# Patient Record
Sex: Female | Born: 1965 | Race: White | Hispanic: No | Smoking: Never smoker
Health system: Southern US, Community
[De-identification: ages and names within clinical notes are randomized; demographics above are authoritative.]

## PROBLEM LIST (undated history)

## (undated) DIAGNOSIS — I10 Essential (primary) hypertension: Secondary | ICD-10-CM

## (undated) DIAGNOSIS — E079 Disorder of thyroid, unspecified: Secondary | ICD-10-CM

## (undated) DIAGNOSIS — E785 Hyperlipidemia, unspecified: Secondary | ICD-10-CM

## (undated) HISTORY — DX: Essential (primary) hypertension: I10

## (undated) HISTORY — PX: NASAL SEPTUM SURGERY: SHX37

## (undated) HISTORY — PX: ANKLE RECONSTRUCTION: SHX1151

## (undated) HISTORY — PX: POLYPECTOMY: SHX149

## (undated) HISTORY — DX: Disorder of thyroid, unspecified: E07.9

## (undated) HISTORY — DX: Hyperlipidemia, unspecified: E78.5

## (undated) HISTORY — PX: TMJ ARTHROPLASTY: SHX1066

---

## 1998-04-23 ENCOUNTER — Other Ambulatory Visit: Admission: RE | Admit: 1998-04-23 | Discharge: 1998-04-23 | Payer: Self-pay | Admitting: Sports Medicine

## 1998-12-24 ENCOUNTER — Other Ambulatory Visit: Admission: RE | Admit: 1998-12-24 | Discharge: 1998-12-24 | Payer: Self-pay | Admitting: Obstetrics and Gynecology

## 1998-12-31 ENCOUNTER — Ambulatory Visit (HOSPITAL_BASED_OUTPATIENT_CLINIC_OR_DEPARTMENT_OTHER): Admission: RE | Admit: 1998-12-31 | Discharge: 1998-12-31 | Payer: Self-pay | Admitting: Orthopedic Surgery

## 1999-02-07 ENCOUNTER — Other Ambulatory Visit: Admission: RE | Admit: 1999-02-07 | Discharge: 1999-02-07 | Payer: Self-pay | Admitting: Obstetrics and Gynecology

## 1999-08-07 ENCOUNTER — Other Ambulatory Visit: Admission: RE | Admit: 1999-08-07 | Discharge: 1999-08-07 | Payer: Self-pay | Admitting: Obstetrics and Gynecology

## 1999-09-11 ENCOUNTER — Other Ambulatory Visit: Admission: RE | Admit: 1999-09-11 | Discharge: 1999-09-11 | Payer: Self-pay | Admitting: Obstetrics and Gynecology

## 1999-09-11 ENCOUNTER — Encounter (INDEPENDENT_AMBULATORY_CARE_PROVIDER_SITE_OTHER): Payer: Self-pay

## 1999-10-23 ENCOUNTER — Encounter (INDEPENDENT_AMBULATORY_CARE_PROVIDER_SITE_OTHER): Payer: Self-pay

## 1999-10-23 ENCOUNTER — Other Ambulatory Visit: Admission: RE | Admit: 1999-10-23 | Discharge: 1999-10-23 | Payer: Self-pay | Admitting: Obstetrics and Gynecology

## 2000-03-04 ENCOUNTER — Other Ambulatory Visit: Admission: RE | Admit: 2000-03-04 | Discharge: 2000-03-04 | Payer: Self-pay | Admitting: Obstetrics and Gynecology

## 2000-09-02 ENCOUNTER — Other Ambulatory Visit: Admission: RE | Admit: 2000-09-02 | Discharge: 2000-09-02 | Payer: Self-pay | Admitting: Obstetrics and Gynecology

## 2001-02-17 ENCOUNTER — Other Ambulatory Visit: Admission: RE | Admit: 2001-02-17 | Discharge: 2001-02-17 | Payer: Self-pay | Admitting: Obstetrics and Gynecology

## 2001-09-01 ENCOUNTER — Other Ambulatory Visit: Admission: RE | Admit: 2001-09-01 | Discharge: 2001-09-01 | Payer: Self-pay | Admitting: Obstetrics and Gynecology

## 2002-09-21 ENCOUNTER — Other Ambulatory Visit: Admission: RE | Admit: 2002-09-21 | Discharge: 2002-09-21 | Payer: Self-pay | Admitting: Obstetrics and Gynecology

## 2003-02-24 ENCOUNTER — Encounter: Payer: Self-pay | Admitting: Family Medicine

## 2003-02-24 ENCOUNTER — Encounter: Admission: RE | Admit: 2003-02-24 | Discharge: 2003-02-24 | Payer: Self-pay | Admitting: Family Medicine

## 2003-09-20 ENCOUNTER — Encounter: Payer: Self-pay | Admitting: Orthopedic Surgery

## 2003-09-20 ENCOUNTER — Encounter: Admission: RE | Admit: 2003-09-20 | Discharge: 2003-09-20 | Payer: Self-pay | Admitting: Orthopedic Surgery

## 2003-10-18 ENCOUNTER — Other Ambulatory Visit: Admission: RE | Admit: 2003-10-18 | Discharge: 2003-10-18 | Payer: Self-pay | Admitting: Obstetrics and Gynecology

## 2004-01-05 ENCOUNTER — Encounter: Admission: RE | Admit: 2004-01-05 | Discharge: 2004-01-05 | Payer: Self-pay | Admitting: Orthopedic Surgery

## 2004-05-06 ENCOUNTER — Encounter: Admission: RE | Admit: 2004-05-06 | Discharge: 2004-05-06 | Payer: Self-pay | Admitting: Sports Medicine

## 2005-02-05 ENCOUNTER — Other Ambulatory Visit: Admission: RE | Admit: 2005-02-05 | Discharge: 2005-02-05 | Payer: Self-pay | Admitting: Obstetrics and Gynecology

## 2006-01-02 ENCOUNTER — Encounter: Admission: RE | Admit: 2006-01-02 | Discharge: 2006-01-02 | Payer: Self-pay | Admitting: Obstetrics and Gynecology

## 2006-03-12 ENCOUNTER — Other Ambulatory Visit: Admission: RE | Admit: 2006-03-12 | Discharge: 2006-03-12 | Payer: Self-pay | Admitting: Obstetrics and Gynecology

## 2007-01-20 ENCOUNTER — Encounter: Admission: RE | Admit: 2007-01-20 | Discharge: 2007-01-20 | Payer: Self-pay | Admitting: Sports Medicine

## 2008-02-21 ENCOUNTER — Encounter: Admission: RE | Admit: 2008-02-21 | Discharge: 2008-02-21 | Payer: Self-pay | Admitting: Sports Medicine

## 2009-05-28 ENCOUNTER — Encounter: Admission: RE | Admit: 2009-05-28 | Discharge: 2009-05-28 | Payer: Self-pay | Admitting: Obstetrics and Gynecology

## 2010-05-30 ENCOUNTER — Encounter: Admission: RE | Admit: 2010-05-30 | Discharge: 2010-05-30 | Payer: Self-pay | Admitting: Obstetrics and Gynecology

## 2011-06-20 ENCOUNTER — Other Ambulatory Visit: Payer: Self-pay | Admitting: Obstetrics and Gynecology

## 2011-06-20 DIAGNOSIS — Z1231 Encounter for screening mammogram for malignant neoplasm of breast: Secondary | ICD-10-CM

## 2011-07-14 ENCOUNTER — Ambulatory Visit
Admission: RE | Admit: 2011-07-14 | Discharge: 2011-07-14 | Disposition: A | Discharge status: Home / Self Care | Payer: BC Managed Care – PPO | Source: Ambulatory Visit | Attending: Obstetrics and Gynecology | Admitting: Obstetrics and Gynecology

## 2011-07-14 DIAGNOSIS — Z1231 Encounter for screening mammogram for malignant neoplasm of breast: Secondary | ICD-10-CM

## 2012-08-03 ENCOUNTER — Encounter: Payer: Self-pay | Admitting: Sports Medicine

## 2012-08-03 ENCOUNTER — Ambulatory Visit (INDEPENDENT_AMBULATORY_CARE_PROVIDER_SITE_OTHER): Payer: BC Managed Care – PPO | Admitting: Sports Medicine

## 2012-08-03 VITALS — BP 126/81 | HR 90 | Ht 69.0 in | Wt 200.0 lb

## 2012-08-03 DIAGNOSIS — E7800 Pure hypercholesterolemia, unspecified: Secondary | ICD-10-CM

## 2012-08-03 DIAGNOSIS — I10 Essential (primary) hypertension: Secondary | ICD-10-CM

## 2012-08-03 DIAGNOSIS — R7989 Other specified abnormal findings of blood chemistry: Secondary | ICD-10-CM

## 2012-08-03 MED ORDER — IBUPROFEN 800 MG PO TABS: 800.0000 mg | ORAL_TABLET | Freq: Three times a day (TID) | ORAL | Status: DC | PRN

## 2012-08-03 MED ORDER — LOSARTAN POTASSIUM-HCTZ 100-25 MG PO TABS: 1.0000 | ORAL_TABLET | Freq: Every day | ORAL | Status: DC

## 2012-08-03 NOTE — Progress Notes (Signed)
  Subjective:    Patient ID: Kimberly Koch, female    DOB: March 22, 1966, 46 y.o.   MRN: [id number]  HPI Kimberly Koch comes in today for a recheck on her blood. She is currently taking losartan/hydrochlorothiazide 100/25 mg daily. No complaints with this medication. She also takes amitriptyline 25 mg each bedtime and ibuprofen 800 mg as needed. Today she is without complaint. 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 She uses a heel lift and is currently not too painful. She also has a history of hypercholesterolemia. Total cholesterol in April of this year was 238, triglycerides were 214, LDL was 152, and HDL was 43. She was also noted at that time to have a slightly elevated TSH at 4.73. She denies any weight loss, tremors, or heat intolerance.    Review of Systems     Objective:   Physical Exam Well-developed, well-nourished. No acute distress blood pressure is 126/81 with pulse of 90. Neck: No carotid bruits, no thyromegaly Cardiovascular: Regular rate without murmurs rubs or gallops Lungs: Clear to auscultation bilateral Abdomen: Benign Extremities: No clubbing cyanosis or edema       Assessment & Plan:  1. Stable hypertension 2. Elevated cholesterol 3. Slightly elevated TSH  I will recheck a fasting lipid profile and a TSH. Blood pressure is well-controlled on her losartan/hydrochlorothiazide and a refill was given today. Refill was also given on her ibuprofen. She is up-to-date on her mammograms and has a gynecologist which she sees for her gynecological issues. I will call her with results of her blood work once available.

## 2012-08-09 MED ORDER — AMITRIPTYLINE HCL 25 MG PO TABS: 25.0000 mg | ORAL_TABLET | Freq: Every day | ORAL | Status: DC

## 2012-08-09 NOTE — Addendum Note (Signed)
Addended by: Annita Brod on: 08/09/2012 09:47 AM   Modules accepted: Orders

## 2012-08-17 ENCOUNTER — Encounter: Payer: Self-pay | Admitting: Sports Medicine

## 2012-09-14 ENCOUNTER — Telehealth: Payer: Self-pay | Admitting: Sports Medicine

## 2012-09-14 NOTE — Telephone Encounter (Signed)
I spoke with Kimberly Koch today on the phone regarding recent blood work. Total cholesterol has improved from 238 back in April to 202 currently. LDL has improved from 152 to 119. Triglycerides are elevated at 233. TSH is stable at 4.5. I've encouraged her to continue with diet and exercise. Don't feel like we need to start her on any statins at this point in time. I will see her back in the office in one year for her yearly checkup and we will recheck her cholesterol and blood pressure at that visit. She'll see me sooner for problems in the interim.

## 2012-12-16 ENCOUNTER — Other Ambulatory Visit: Payer: Self-pay | Admitting: Obstetrics and Gynecology

## 2012-12-16 DIAGNOSIS — Z1231 Encounter for screening mammogram for malignant neoplasm of breast: Secondary | ICD-10-CM

## 2013-01-03 ENCOUNTER — Ambulatory Visit
Admission: RE | Admit: 2013-01-03 | Discharge: 2013-01-03 | Disposition: A | Discharge status: Home / Self Care | Payer: BC Managed Care – PPO | Source: Ambulatory Visit | Attending: Obstetrics and Gynecology | Admitting: Obstetrics and Gynecology

## 2013-01-03 DIAGNOSIS — Z1231 Encounter for screening mammogram for malignant neoplasm of breast: Secondary | ICD-10-CM

## 2013-01-18 ENCOUNTER — Ambulatory Visit (INDEPENDENT_AMBULATORY_CARE_PROVIDER_SITE_OTHER): Payer: BC Managed Care – PPO | Admitting: Sports Medicine

## 2013-01-18 VITALS — BP 134/88 | Temp 98.7°F | Ht 69.0 in | Wt 200.0 lb

## 2013-01-18 DIAGNOSIS — J40 Bronchitis, not specified as acute or chronic: Secondary | ICD-10-CM | POA: Insufficient documentation

## 2013-01-18 MED ORDER — AZITHROMYCIN 250 MG PO TABS: ORAL_TABLET | ORAL | Status: DC

## 2013-01-18 NOTE — Progress Notes (Signed)
  Subjective:    Patient ID: Kimberly Koch, female    DOB: December 30, 1965, 47 y.o.   MRN: [id number]  HPI chief complaint: Cough and congestion  Kimberly Koch comes in today complaining of cough and congestion for one week. Started out with a sore throat and a slight fever. Has now developed congestion and a cough productive of yellow mucus. She's also been wheezing. She's also complaining of some right-sided facial pain and swelling. She's been taking Tylenol as well as Sudafed. She is feeling fatigued as well.  Interim medical history is unchanged    Review of Systems     Objective:   Physical Exam Well-developed, mild distressed due to her illness. Temperature 98.6 HEENT: There is some maxillary tenderness to percussion on the right as well as some mild soft tissue swelling. Mild anterior cervical lymphadenopathy. Throat is erythematous without exudate. Tympanic membranes are intact and clear. Cardiovascular: Regular rate, no murmurs rubs gallops Lungs: There are some rhonchi in the left lower lung field. No rales. No wheezing. Good air movement. Extremities: No edema       Assessment & Plan:  1. Bronchitis with sinusitis  Z-Pak as directed. Over-the-counter antihistamine, decongestant, cough suppressant combination. Continue with Tylenol as needed for aches and pains. Encourage rest and plenty of fluids. Out of work today and tomorrow. She will let he know if symptoms worsen. Followup when necessary.

## 2013-07-25 ENCOUNTER — Ambulatory Visit (INDEPENDENT_AMBULATORY_CARE_PROVIDER_SITE_OTHER): Payer: BC Managed Care – PPO | Admitting: Sports Medicine

## 2013-07-25 DIAGNOSIS — R946 Abnormal results of thyroid function studies: Secondary | ICD-10-CM

## 2013-07-25 DIAGNOSIS — E7800 Pure hypercholesterolemia, unspecified: Secondary | ICD-10-CM

## 2013-07-25 DIAGNOSIS — R7989 Other specified abnormal findings of blood chemistry: Secondary | ICD-10-CM

## 2013-07-25 DIAGNOSIS — I10 Essential (primary) hypertension: Secondary | ICD-10-CM

## 2013-07-25 MED ORDER — AMITRIPTYLINE HCL 25 MG PO TABS: 25.0000 mg | ORAL_TABLET | Freq: Every day | ORAL | Status: DC

## 2013-07-25 MED ORDER — LOSARTAN POTASSIUM-HCTZ 100-25 MG PO TABS: 1.0000 | ORAL_TABLET | Freq: Every day | ORAL | Status: DC

## 2013-07-25 MED ORDER — FLUOXETINE HCL 10 MG PO TABS: 10.0000 mg | ORAL_TABLET | Freq: Every day | ORAL | Status: DC

## 2013-07-25 MED ORDER — PHENYLEPHRINE-DM-GG 10-20-400 MG PO TABS: 1.0000 | ORAL_TABLET | Freq: Two times a day (BID) | ORAL | Status: DC

## 2013-07-26 NOTE — Progress Notes (Signed)
  Subjective:    Patient ID: Kimberly Koch, female    DOB: 1966/12/12, 47 y.o.   MRN: [id number]  HPI Patient comes in today for followup on hypertension. Doing well. She is requesting a refill on Prozac which she takes on a when necessary basis for premenstrual syndrome. She is currently on amitriptyline 25 mg each bedtime when necessary. She is tolerating her Hyzaar. She is without complaint today. Recent blood work has been done and is available for review.    Review of Systems     Objective:   Physical Exam Well-developed, well-nourished. No acute distress. Blood pressure 130/92.  HEENT: Normocephalic, atraumatic. Pupils equal round and reactive to light and accommodation. Neck: Supple, no JVD, no lymphadenopathy Cardiovascular: Regular rate, no murmurs rubs or gallops. Lungs: Clear to auscultation bilaterally. No rhonchi, wheezes, or rales. Abdomen: Benign Extremities: No clubbing, cyanosis, or edema  Laboratory results are reviewed. TSH remains elevated at 5.19. Triglycerides are 308. LDL 134. She has normal renal and hepatic function      Assessment & Plan:  1. Hypertension 2. Hypothyroidism 3. 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 Premenstrual syndrome  Patient will continue on her current dose of Hyzaar. I think her hypertriglyceridemia may be secondary to her hypothyroidism. Therefore, I'm going to start her on a low 50 mcg dose of levothyroxin and I will recheck a TSH in 6 weeks. I've refilled her Hyzaar and given her a prescription for Prozac to use when necessary for premenstrual syndrome as she has done for quite some time. I've also given her a refill on a combination guaifenesin/phenylephrine/DM allergy medicine to take when necessary as well. She has taken this in the past with good results. She is to followup with me again in 6 months but I will call her after I reviewed her repeat TSH in 6 weeks.

## 2013-07-29 ENCOUNTER — Other Ambulatory Visit: Payer: Self-pay | Admitting: *Deleted

## 2013-07-29 MED ORDER — LEVOTHYROXINE SODIUM 50 MCG PO TABS: 50.0000 ug | ORAL_TABLET | Freq: Every day | ORAL | Status: DC

## 2013-09-14 ENCOUNTER — Telehealth: Payer: Self-pay | Admitting: Sports Medicine

## 2013-09-14 NOTE — Telephone Encounter (Signed)
  I left a message on Kimberly Koch's voicemail today. TSH is at 2.97 which is a good improvement. We'll continue on her same dose of Synthroid and I'll plan on checking her TSH again in 6 weeks. If it remains stable we can plan on checking it twice yearly. I've asked her to call me in 6 weeks for an order for a repeat TSH to be done at [facility].

## 2013-12-26 ENCOUNTER — Other Ambulatory Visit: Payer: Self-pay

## 2013-12-26 DIAGNOSIS — Z1231 Encounter for screening mammogram for malignant neoplasm of breast: Secondary | ICD-10-CM

## 2013-12-29 ENCOUNTER — Telehealth: Payer: Self-pay | Admitting: Sports Medicine

## 2013-12-29 NOTE — Telephone Encounter (Signed)
TSH is 2.74. Continue on current dose of Synthroid and followup with me in 6 months.

## 2014-01-12 ENCOUNTER — Other Ambulatory Visit: Payer: Self-pay | Admitting: Sports Medicine

## 2014-01-16 ENCOUNTER — Ambulatory Visit
Admission: RE | Admit: 2014-01-16 | Discharge: 2014-01-16 | Disposition: A | Discharge status: Home / Self Care | Payer: BC Managed Care – PPO | Source: Ambulatory Visit

## 2014-01-16 DIAGNOSIS — Z1231 Encounter for screening mammogram for malignant neoplasm of breast: Secondary | ICD-10-CM

## 2014-02-12 ENCOUNTER — Other Ambulatory Visit: Payer: Self-pay | Admitting: Sports Medicine

## 2014-03-10 ENCOUNTER — Other Ambulatory Visit: Payer: Self-pay | Admitting: *Deleted

## 2014-03-10 MED ORDER — PHENYLEPHRINE-GUAIFENESIN 10-400 MG PO TABS: ORAL_TABLET | ORAL | Status: DC

## 2014-07-11 ENCOUNTER — Other Ambulatory Visit: Payer: Self-pay | Admitting: Sports Medicine

## 2014-07-13 ENCOUNTER — Other Ambulatory Visit: Payer: Self-pay | Admitting: Sports Medicine

## 2014-08-06 ENCOUNTER — Other Ambulatory Visit: Payer: Self-pay | Admitting: Sports Medicine

## 2014-12-15 HISTORY — PX: COLONOSCOPY: SHX174

## 2014-12-26 ENCOUNTER — Other Ambulatory Visit: Payer: Self-pay

## 2014-12-26 DIAGNOSIS — Z1231 Encounter for screening mammogram for malignant neoplasm of breast: Secondary | ICD-10-CM

## 2015-01-08 ENCOUNTER — Ambulatory Visit: Payer: Self-pay | Admitting: Sports Medicine

## 2015-01-15 ENCOUNTER — Ambulatory Visit (INDEPENDENT_AMBULATORY_CARE_PROVIDER_SITE_OTHER): Payer: BLUE CROSS/BLUE SHIELD | Admitting: Sports Medicine

## 2015-01-15 ENCOUNTER — Encounter: Payer: Self-pay | Admitting: Sports Medicine

## 2015-01-15 VITALS — BP 120/78 | Ht 69.0 in | Wt 190.0 lb

## 2015-01-15 DIAGNOSIS — E7800 Pure hypercholesterolemia, unspecified: Secondary | ICD-10-CM

## 2015-01-15 DIAGNOSIS — R059 Cough, unspecified: Secondary | ICD-10-CM

## 2015-01-15 DIAGNOSIS — E039 Hypothyroidism, unspecified: Secondary | ICD-10-CM | POA: Diagnosis not present

## 2015-01-15 DIAGNOSIS — R05 Cough: Secondary | ICD-10-CM

## 2015-01-15 DIAGNOSIS — Z0000 Encounter for general adult medical examination without abnormal findings: Secondary | ICD-10-CM

## 2015-01-15 DIAGNOSIS — I10 Essential (primary) hypertension: Secondary | ICD-10-CM

## 2015-01-15 MED ORDER — FLUOXETINE HCL 10 MG PO TABS: 10.0000 mg | ORAL_TABLET | Freq: Every day | ORAL | Status: DC

## 2015-01-15 MED ORDER — LOSARTAN POTASSIUM-HCTZ 100-25 MG PO TABS: 1.0000 | ORAL_TABLET | Freq: Every day | ORAL | Status: DC

## 2015-01-15 MED ORDER — AMITRIPTYLINE HCL 25 MG PO TABS: ORAL_TABLET | ORAL | Status: DC

## 2015-01-15 NOTE — Progress Notes (Signed)
   Subjective:    Patient ID: Kimberly Koch, female    DOB: 1966-05-02, 49 y.o.   MRN: [id number]  HPI chief complaint: Physical exam  49 year old female comes in today for her annual physical exam. She has a history of hypertension, hypothyroidism, and hypertriglyceridemia secondary to her hypothyroidism. Unfortunately she is having some marital problems and is debating whether or not to leave her husband. This has been an ongoing issue with her for quite some time. Otherwise she is doing okay. Only other complaint is some mild discomfort in her throat. She was sick with a URI a few weeks ago and has had a persistent "gravelly" feeling in her throat. 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 She needs refills on her medications today. She has an appointment with her gynecologist in a couple of months. She recently had blood work done which is available for review. Current medications include losartan, Synthroid, Prozac, amitriptyline, and she is also taking the aforementioned over-the-counter Prilosec and was recently placed on Mobic for knee pain. Allergies reviewed    Review of Systems She denies chest pain, shortness of breath, abdominal pain, or swelling. Positive for cough.    Objective:   Physical Exam Well-developed, well-nourished. No acute distress. Blood pressure 120/78  Neck: Supple. No JVD or lymphadenopathy. No carotid bruits Cardiovascular: Regular rate. No murmurs, rubs, or gallops. Lungs: Clear to auscultation bilaterally. No rhonchi, rales, or wheezes. Abdomen: Benign Extremities: No clubbing, cyanosis, or edema  CBC is within normal limits. CMP shows a slightly low potassium (3.2) but this is stable. Triglycerides are much improved at 125. LDL still elevated at 154. TSH is normal at 1.9.       Assessment & Plan:   Hypertension-controlled Hypothyroidism-controlled Elevated LDL Possible reflux disease  Blood pressure is under much better control. We refilled her Hyzaar, amitriptyline, and Prozac today. She will let me know when she needs refills on her Synthroid. I encouraged diet and exercise to help control her LDL. She'll continue with Prilosec for about 3 weeks to see if that helps with her throat discomfort. I've also warned her that NSAIDs such as mobile and ibuprofen can cause reflux type symptoms.  I will also order a plain chest x-ray and I will call her with those results once available. I will see her back in the office in one-year but I will recheck a TSH in 6 months. She will discuss screening colonoscopy with her gynecologist at her follow-up visit with him in 2 months.

## 2015-01-16 ENCOUNTER — Encounter: Payer: Self-pay | Admitting: Sports Medicine

## 2015-01-22 ENCOUNTER — Ambulatory Visit
Admission: RE | Admit: 2015-01-22 | Discharge: 2015-01-22 | Disposition: A | Discharge status: Home / Self Care | Payer: BLUE CROSS/BLUE SHIELD | Source: Ambulatory Visit

## 2015-01-22 DIAGNOSIS — Z1231 Encounter for screening mammogram for malignant neoplasm of breast: Secondary | ICD-10-CM

## 2015-01-28 ENCOUNTER — Other Ambulatory Visit: Payer: Self-pay | Admitting: Sports Medicine

## 2015-02-06 ENCOUNTER — Other Ambulatory Visit: Payer: Self-pay | Admitting: *Deleted

## 2015-02-06 ENCOUNTER — Telehealth: Payer: Self-pay | Admitting: Sports Medicine

## 2015-02-06 MED ORDER — ALPRAZOLAM 0.5 MG PO TABS: ORAL_TABLET | ORAL | Status: DC

## 2015-02-06 NOTE — Telephone Encounter (Signed)
I spoke with An on the phone today. She informed me that she has filed divorce papers and has met with an attorney. She has had long-standing marital issues and is now ready for divorce. This is obviously very stressful for her. She is requesting something to help her relax particularly at night. I will call her in a few Xanax 0.74m for her to take at night as needed. She will start with half a tablet and understands that this is not a long-term solution.

## 2015-02-28 ENCOUNTER — Encounter: Payer: Self-pay | Admitting: Gastroenterology

## 2015-05-01 ENCOUNTER — Encounter: Payer: BLUE CROSS/BLUE SHIELD | Admitting: Gastroenterology

## 2015-06-28 ENCOUNTER — Encounter: Payer: Self-pay | Admitting: Obstetrics and Gynecology

## 2015-07-12 ENCOUNTER — Encounter: Payer: BLUE CROSS/BLUE SHIELD | Admitting: Internal Medicine

## 2015-07-30 ENCOUNTER — Other Ambulatory Visit: Payer: Self-pay | Admitting: *Deleted

## 2015-07-30 MED ORDER — LEVOTHYROXINE SODIUM 50 MCG PO TABS: 50.0000 ug | ORAL_TABLET | Freq: Every day | ORAL | Status: DC

## 2015-09-05 ENCOUNTER — Encounter: Payer: Self-pay | Admitting: Sports Medicine

## 2015-10-01 ENCOUNTER — Other Ambulatory Visit: Payer: Self-pay | Admitting: *Deleted

## 2015-10-01 MED ORDER — LOSARTAN POTASSIUM-HCTZ 100-25 MG PO TABS: 1.0000 | ORAL_TABLET | Freq: Every day | ORAL | Status: DC

## 2016-01-18 ENCOUNTER — Telehealth: Payer: Self-pay | Admitting: Sports Medicine

## 2016-01-18 NOTE — Telephone Encounter (Signed)
I spoke with Javaeh today on the phone after reviewing some labs that were drawn earlier this week. She's been complaining of some cramping in her lower legs particularly at night. CBC is unremarkable. CMP is also unremarkable. Potassium and calcium are normal. Cholesterol is elevated at 228 and her total triglycerides are also elevated at 228. TSH is within normal range.  [place] thinks that it may be the losartan that is responsible for her cramping. She has found that if she takes it every other day, her cramping is less. I've asked her to stop it for a week and see if her symptoms completely resolve. If so, then she will need to schedule an office visit so that we can discuss starting a different class of antihypertensive medication. She will call me in a week to let me know how she is doing.

## 2016-01-28 ENCOUNTER — Other Ambulatory Visit: Payer: Self-pay | Admitting: *Deleted

## 2016-01-28 MED ORDER — AMITRIPTYLINE HCL 25 MG PO TABS: ORAL_TABLET | ORAL | Status: DC

## 2016-01-29 ENCOUNTER — Encounter: Payer: Self-pay | Admitting: Sports Medicine

## 2016-01-29 ENCOUNTER — Ambulatory Visit (INDEPENDENT_AMBULATORY_CARE_PROVIDER_SITE_OTHER): Payer: BLUE CROSS/BLUE SHIELD | Admitting: Sports Medicine

## 2016-01-29 VITALS — BP 138/98 | Ht 68.0 in | Wt 200.0 lb

## 2016-01-29 DIAGNOSIS — I10 Essential (primary) hypertension: Secondary | ICD-10-CM | POA: Diagnosis not present

## 2016-01-30 NOTE — Progress Notes (Signed)
Patient ID: Kimberly Koch, female   DOB: 1966/08/17, 50 y.o.   MRN: [id number]  Patient comes in today at my request. She was experiencing some daily lower extremity cramping which was felt to be the result of her losartan/hydrochlorothiazide. She discontinued the medicine and has not had any cramping since. She is here today for a blood pressure check. Recent blood work was unremarkable other than some elevated cholesterol. Her blood pressure today is 138/98. I will start her back on the 25 mg of hydrochlorothiazide and recheck her blood pressure in a month. If she experiences cramping she will let me know and I will change to a different class of antihypertensives.  Total time spent with the patient was 10 minutes with greater than 50% of the time spent in face-to-face consultation discussing this simple medication change.

## 2016-02-05 ENCOUNTER — Other Ambulatory Visit: Payer: Self-pay

## 2016-02-05 DIAGNOSIS — Z1231 Encounter for screening mammogram for malignant neoplasm of breast: Secondary | ICD-10-CM

## 2016-02-19 MED FILL — TRIANEX 0.05% OINTMENT: 0.05 | 30 days supply | Qty: 430 | Fill #0

## 2016-02-25 ENCOUNTER — Ambulatory Visit
Admission: RE | Admit: 2016-02-25 | Discharge: 2016-02-25 | Disposition: A | Discharge status: Home / Self Care | Payer: BLUE CROSS/BLUE SHIELD | Source: Ambulatory Visit

## 2016-02-25 DIAGNOSIS — Z1231 Encounter for screening mammogram for malignant neoplasm of breast: Secondary | ICD-10-CM

## 2016-03-04 ENCOUNTER — Telehealth: Payer: Self-pay | Admitting: Sports Medicine

## 2016-03-04 NOTE — Telephone Encounter (Signed)
  Kimberly Koch called me today with an update on her blood pressure. We changed her from losartan/hydrochlorothiazide to 25 mg of hydrochlorothiazide after she was complaining of some cramping which was felt to be due to her blood pressure medicine. Her cramping has resolved but her blood pressure is elevated. Today it is 145/96 and she is experiencing a headache. I recommended that she resume taking her losartan hydrochlorothiazide (and obviously discontinuing the 25 mg hydrochlorothiazide pill) and call me with an update later this week. If her cramping returns then I will need to try her on a different class of antihypertensives.

## 2016-03-16 ENCOUNTER — Other Ambulatory Visit: Payer: Self-pay | Admitting: Sports Medicine

## 2016-03-17 ENCOUNTER — Other Ambulatory Visit: Payer: Self-pay | Admitting: *Deleted

## 2016-03-17 ENCOUNTER — Telehealth: Payer: Self-pay | Admitting: Sports Medicine

## 2016-03-17 MED ORDER — CANDESARTAN CILEXETIL 16 MG PO TABS: 16.0000 mg | ORAL_TABLET | Freq: Every day | ORAL | Status: DC

## 2016-03-17 MED FILL — CANDESARTAN CILEXETIL 16 MG: 16 | 60 days supply | Qty: 60 | Fill #0

## 2016-03-17 NOTE — Telephone Encounter (Signed)
Kimberly Koch called today to tell me she is feeling much better after stopping the losartan/HCTZ. However, her blood pressure has started to increase again. Today it is 136/103. Reviewing her past medical records shows that she was on Atacand 16 mg and 12.5 mg of HCTZ several years ago before starting losartan. Neither Kerry-Ann nor myself are quite sure why this was stopped so I will restart her on the Atacand 16 with instructions to take it once daily. She will take half of her 25 mg hydrochlorothiazide and she will let me know what her blood pressure reading is in one week. If the Atacand is too expensive then I may need to change it to a low-dose calcium channel blocker (2.5 mg).

## 2016-03-20 MED FILL — CEPHALEXIN 500 MG CAPSULE: 500 | 10 days supply | Qty: 40 | Fill #0

## 2016-03-24 MED FILL — CEFUROXIME AXETIL 500 MG TA: 500 | 14 days supply | Qty: 28 | Fill #0

## 2016-03-25 MED FILL — FLUCONAZOLE 150 MG TABLET: 150 | 4 days supply | Qty: 2 | Fill #0

## 2016-05-14 MED FILL — CANDESARTAN CILEXETIL 16 MG: 16 | 60 days supply | Qty: 60 | Fill #1

## 2016-05-26 ENCOUNTER — Other Ambulatory Visit: Payer: Self-pay | Admitting: Internal Medicine

## 2016-05-26 ENCOUNTER — Ambulatory Visit (INDEPENDENT_AMBULATORY_CARE_PROVIDER_SITE_OTHER): Payer: BLUE CROSS/BLUE SHIELD | Admitting: Sports Medicine

## 2016-05-26 ENCOUNTER — Encounter: Payer: Self-pay | Admitting: Sports Medicine

## 2016-05-26 VITALS — BP 128/79 | Ht 69.0 in | Wt 195.0 lb

## 2016-05-26 DIAGNOSIS — J069 Acute upper respiratory infection, unspecified: Secondary | ICD-10-CM

## 2016-05-26 MED ORDER — HYDROCHLOROTHIAZIDE 25 MG PO TABS: 25.0000 mg | ORAL_TABLET | Freq: Every day | ORAL | Status: DC

## 2016-05-26 MED ORDER — AZITHROMYCIN 250 MG PO TABS: ORAL_TABLET | ORAL | Status: DC

## 2016-05-26 MED ORDER — BENZONATATE 100 MG PO CAPS: 100.0000 mg | ORAL_CAPSULE | Freq: Three times a day (TID) | ORAL | Status: DC | PRN

## 2016-05-26 MED FILL — AZITHROMYCIN 250 MG TABLET: 250 | 5 days supply | Qty: 6 | Fill #0

## 2016-05-26 MED FILL — BENZONATATE 100 MG CAPSULE: 100 | 20 days supply | Qty: 60 | Fill #0

## 2016-05-26 MED FILL — HYDROCHLOROTHIAZIDE 25 MG T: 25 | 30 days supply | Qty: 30 | Fill #0

## 2016-05-26 NOTE — Progress Notes (Signed)
   Subjective:    Patient ID: Kimberly Koch, female    DOB: [birth year], 50 y.o.   MRN: [id number]  HPI chief complaint: Cough and congestion  Kimberly Koch comes in today complaining of 4 days of a cough and congestion. Originally started as a sore throat and progressed into a mildly productive cough. She also had a slight fever when this first started. She took some ibuprofen last night and her sore throat is not quite as bad today. She has also been taking guaifenesin, Mucinex, and Sudafed without much symptom relief. She also took a single dose of doxycycline last night which made her vomit. She denies any nasal congestion. Her cough is worse at night. No known sick contacts.  Medications reviewed Allergies reviewed    Review of Systems     Objective:   Physical Exam  Well-developed, well-nourished. No acute distress. Afebrile  HEENT: Tympanic membranes are intact and clear bilaterally. Throat is erythematous without any obvious exudate. No obvious abscesses. Nares patent. Neck: Bilateral cervical lymphadenopathy. Cardiovascular: Regular rate, no murmurs rubs or gallops Lungs: Clear to auscultation bilaterally. No rhonchi, rales, or wheezes.       Assessment & Plan:   URI   I think the patient's symptoms are viral. I will give her some Tessalon Perles to take at night for cough. She will continue with her Mucinex and guaifenesin. I will go ahead and give her a prescription for a Z-Pak but I want her to hold onto it for 2-3 days and take it only if her symptoms are worsening. I did explain to her that the cough and congestion may last for couple of weeks but as long as she is feeling okay and afebrile then she should not take the antibiotic. She understands. She will follow-up with me as scheduled for her yearly physical. Of note, her blood pressure is currently well controlled on her current pressure medications.

## 2016-07-10 ENCOUNTER — Other Ambulatory Visit: Payer: Self-pay | Admitting: Sports Medicine

## 2016-07-10 MED FILL — CANDESARTAN CILEXETIL 16 MG: 16 | 60 days supply | Qty: 60 | Fill #0

## 2016-09-11 MED FILL — CANDESARTAN CILEXETIL 16 MG: 16 | 60 days supply | Qty: 60 | Fill #1

## 2016-10-02 MED FILL — CIPROFLOXACIN 0.3% EYE DRO: 0.3 | 8 days supply | Qty: 5 | Fill #0

## 2016-10-02 MED FILL — AZITHROMYCIN 250 MG TABLET: 250 | 5 days supply | Qty: 6 | Fill #0

## 2016-11-04 ENCOUNTER — Other Ambulatory Visit: Payer: Self-pay | Admitting: Sports Medicine

## 2016-11-04 MED FILL — CANDESARTAN CILEXETIL 16 MG: 16 | 60 days supply | Qty: 60 | Fill #0

## 2016-11-04 NOTE — Telephone Encounter (Signed)
Is this ok to refill?  

## 2016-11-11 MED FILL — CEFDINIR 300 MG CAPSULE: 300 | 10 days supply | Qty: 20 | Fill #0

## 2017-01-02 MED FILL — CANDESARTAN CILEXETIL 16 MG: 16 | 60 days supply | Qty: 60 | Fill #1

## 2017-01-30 ENCOUNTER — Other Ambulatory Visit: Payer: Self-pay | Admitting: *Deleted

## 2017-01-30 MED ORDER — AMITRIPTYLINE HCL 25 MG PO TABS: ORAL_TABLET | ORAL | 3 refills | Status: DC

## 2017-02-06 ENCOUNTER — Other Ambulatory Visit: Payer: Self-pay | Admitting: *Deleted

## 2017-02-06 MED ORDER — FLUOXETINE HCL 10 MG PO TABS: 10.0000 mg | ORAL_TABLET | Freq: Every day | ORAL | 3 refills | Status: DC

## 2017-02-06 MED ORDER — AMITRIPTYLINE HCL 25 MG PO TABS: ORAL_TABLET | ORAL | 3 refills | Status: DC

## 2017-02-06 MED FILL — AMITRIPTYLINE HCL 25 MG TAB: 25 | 90 days supply | Qty: 90 | Fill #0

## 2017-02-06 MED FILL — FLUoxetine HCL 10 MG TABS: 10 | 30 days supply | Qty: 30 | Fill #0

## 2017-02-12 ENCOUNTER — Other Ambulatory Visit: Payer: Self-pay | Admitting: Obstetrics and Gynecology

## 2017-02-12 DIAGNOSIS — E039 Hypothyroidism, unspecified: Secondary | ICD-10-CM

## 2017-02-12 DIAGNOSIS — Z1231 Encounter for screening mammogram for malignant neoplasm of breast: Secondary | ICD-10-CM

## 2017-02-18 ENCOUNTER — Telehealth: Payer: Self-pay | Admitting: Sports Medicine

## 2017-02-18 ENCOUNTER — Encounter: Payer: Self-pay | Admitting: Sports Medicine

## 2017-02-18 NOTE — Telephone Encounter (Signed)
  I spoke with Kimberly Koch on the phone today after reviewing some recent lab work. Her LDL has increased from 142 last year to 161 this year. Her TSH is also elevated at 4.87. Triglycerides have improved from 228 to 186. [name] has not been taking her thyroid medication so I've instructed her to resume that and we will recheck a TSH and a fasting lipid profile in 6 months. If her LDL remains elevated then we will need to consider starting medication. I have highly encouraged her to avoid foods rich in cholesterol and fat and to start some sort of exercise program.

## 2017-03-03 ENCOUNTER — Ambulatory Visit
Admission: RE | Admit: 2017-03-03 | Discharge: 2017-03-03 | Disposition: A | Discharge status: Home / Self Care | Payer: BLUE CROSS/BLUE SHIELD | Source: Ambulatory Visit | Attending: Obstetrics and Gynecology | Admitting: Obstetrics and Gynecology

## 2017-03-03 DIAGNOSIS — Z1231 Encounter for screening mammogram for malignant neoplasm of breast: Secondary | ICD-10-CM

## 2017-03-03 DIAGNOSIS — E039 Hypothyroidism, unspecified: Secondary | ICD-10-CM

## 2017-03-06 MED FILL — CANDESARTAN CILEXETIL 16 MG: 16 | 60 days supply | Qty: 60 | Fill #2

## 2017-03-17 ENCOUNTER — Other Ambulatory Visit: Payer: Self-pay | Admitting: *Deleted

## 2017-03-17 MED ORDER — LEVOTHYROXINE SODIUM 50 MCG PO TABS: ORAL_TABLET | ORAL | 6 refills | Status: DC

## 2017-03-17 MED FILL — LEVOTHYROXINE 50 MCG TABLET: 50 | 60 days supply | Qty: 60 | Fill #0

## 2017-04-29 MED FILL — CANDESARTAN CILEXETIL 16 MG: 16 | 60 days supply | Qty: 60 | Fill #3

## 2017-04-29 MED FILL — AMITRIPTYLINE HCL 25 MG TAB: 25 | 90 days supply | Qty: 90 | Fill #1

## 2017-07-07 ENCOUNTER — Other Ambulatory Visit: Payer: Self-pay

## 2017-07-07 MED ORDER — CANDESARTAN CILEXETIL 16 MG PO TABS: 16.0000 mg | ORAL_TABLET | Freq: Every day | ORAL | 0 refills | Status: DC

## 2017-07-07 MED FILL — CANDESARTAN CILEXETIL 16 MG: 16 | 30 days supply | Qty: 30 | Fill #0

## 2017-07-07 MED FILL — tiZANidine HCL 4 MG TABS: 4 | 30 days supply | Qty: 30 | Fill #0

## 2017-07-14 ENCOUNTER — Ambulatory Visit: Payer: BLUE CROSS/BLUE SHIELD | Admitting: Sports Medicine

## 2017-07-21 ENCOUNTER — Ambulatory Visit (INDEPENDENT_AMBULATORY_CARE_PROVIDER_SITE_OTHER): Payer: BLUE CROSS/BLUE SHIELD | Admitting: Sports Medicine

## 2017-07-21 VITALS — BP 146/100 | Ht 68.0 in | Wt 195.0 lb

## 2017-07-21 DIAGNOSIS — E039 Hypothyroidism, unspecified: Secondary | ICD-10-CM

## 2017-07-21 DIAGNOSIS — I10 Essential (primary) hypertension: Secondary | ICD-10-CM | POA: Diagnosis not present

## 2017-07-21 MED ORDER — LEVOTHYROXINE SODIUM 50 MCG PO TABS: ORAL_TABLET | ORAL | 6 refills | Status: DC

## 2017-07-21 MED ORDER — CANDESARTAN CILEXETIL 16 MG PO TABS: 16.0000 mg | ORAL_TABLET | Freq: Every day | ORAL | 0 refills | Status: DC

## 2017-07-21 MED ORDER — AMITRIPTYLINE HCL 25 MG PO TABS: ORAL_TABLET | ORAL | 3 refills | Status: DC

## 2017-07-21 MED FILL — LEVOTHYROXINE 50 MCG TABLET: 50 | 30 days supply | Qty: 30 | Fill #0

## 2017-07-21 MED FILL — AMITRIPTYLINE HCL 25 MG TAB: 25 | 30 days supply | Qty: 30 | Fill #0

## 2017-07-21 NOTE — Progress Notes (Signed)
   Subjective:    Patient ID: Kimberly Koch, female    DOB: [birth year], 51 y.o.   MRN: [id number]  HPI Kimberly Koch comes in today for follow-up on elevated TSH and elevated LDL. She has been more compliant with taking her Synthroid. She is also trying to be more compliant with healthy eating.  She needs refills on her blood pressure medication and amitriptyline. She takes the amitriptyline at night to help her sleep. She is without complaint today     Review of Systems Denies weight loss, fevers, chills, chest pain, shortness of breath, abdominal pain, or lower extremity swelling     Objective:   Physical Exam  Well-developed, well-nourished. No acute distress. Awake alert and oriented 3. Vital signs reviewed  HEENT: Normocephalic, atraumatic. Pupils are equal and reactive to light and accommodation.   neck: No carotid bruits. No thyromegaly. Cardiovascular: Regular rate. No murmurs, rubs, or gallops. Lungs: Clear to auscultation bilaterally. No rhonchi, rales, or wheezes. Abdomen: Benign Extremities: No edema      Assessment & Plan:   Hypertension Hypothyroidism  Patient's blood pressure is elevated today but she has been taking Duexis for some low back pain. This is likely contributing to her elevated blood pressure. She tells me that her blood pressure typically runs 130/84 when she checks them on her home blood pressure monitor. I will refill her Atacand and check a CBC and CMP. I will also recheck a TSH and a fasting lipid profile. If her LDL is still significantly elevated then we will need to consider starting a statin. I will refill her amitriptyline to take at night. Phone follow-up with the results of her blood work when available.

## 2017-08-06 MED FILL — CANDESARTAN CILEXETIL 16 MG: 16 | 30 days supply | Qty: 30 | Fill #0

## 2017-08-28 ENCOUNTER — Other Ambulatory Visit: Payer: Self-pay | Admitting: Sports Medicine

## 2017-08-28 MED FILL — AMITRIPTYLINE HCL 25 MG TAB: 25 | 30 days supply | Qty: 30 | Fill #1

## 2017-08-31 ENCOUNTER — Other Ambulatory Visit: Payer: Self-pay

## 2017-08-31 MED ORDER — CANDESARTAN CILEXETIL 16 MG PO TABS: 16.0000 mg | ORAL_TABLET | Freq: Every day | ORAL | 2 refills | Status: DC

## 2017-08-31 MED FILL — CANDESARTAN CILEXETIL 16 MG: 16 | 30 days supply | Qty: 30 | Fill #0

## 2017-09-25 MED FILL — CANDESARTAN CILEXETIL 16 MG: 16 | 30 days supply | Qty: 30 | Fill #1

## 2017-09-25 MED FILL — tiZANidine HCL 4 MG TABS: 4 | 30 days supply | Qty: 30 | Fill #1

## 2017-09-25 MED FILL — AMITRIPTYLINE HCL 25 MG TAB: 25 | 30 days supply | Qty: 30 | Fill #2

## 2017-11-02 MED FILL — CANDESARTAN CILEXETIL 16 MG: 16 | 30 days supply | Qty: 30 | Fill #2

## 2017-11-02 MED FILL — AMITRIPTYLINE HCL 25 MG TAB: 25 | 30 days supply | Qty: 30 | Fill #3

## 2017-11-30 MED FILL — CANDESARTAN CILEXETIL 16 MG: 16 | 30 days supply | Qty: 30 | Fill #3

## 2017-11-30 MED FILL — AMITRIPTYLINE HCL 25 MG TAB: 25 | 30 days supply | Qty: 30 | Fill #4

## 2017-12-31 MED FILL — FLUoxetine HCL 10 MG TABS: 10 | 30 days supply | Qty: 30 | Fill #1

## 2017-12-31 MED FILL — CANDESARTAN CILEXETIL 16 MG: 16 | 30 days supply | Qty: 30 | Fill #4

## 2017-12-31 MED FILL — AMITRIPTYLINE HCL 25 MG TAB: 25 | 30 days supply | Qty: 30 | Fill #5

## 2017-12-31 MED FILL — LEVOTHYROXINE 50 MCG TABLET: 50 | 30 days supply | Qty: 30 | Fill #1

## 2017-12-31 MED FILL — tiZANidine HCL 4 MG TABS: 4 | 30 days supply | Qty: 30 | Fill #2

## 2018-01-26 MED FILL — AMITRIPTYLINE HCL 25 MG TAB: 25 | 30 days supply | Qty: 30 | Fill #6

## 2018-01-26 MED FILL — CANDESARTAN CILEXETIL 16 MG: 16 | 30 days supply | Qty: 30 | Fill #5

## 2018-02-09 ENCOUNTER — Other Ambulatory Visit: Payer: Self-pay | Admitting: Obstetrics and Gynecology

## 2018-02-09 DIAGNOSIS — Z139 Encounter for screening, unspecified: Secondary | ICD-10-CM

## 2018-03-02 ENCOUNTER — Ambulatory Visit: Payer: BLUE CROSS/BLUE SHIELD | Admitting: Sports Medicine

## 2018-03-02 VITALS — BP 132/100 | Ht 69.0 in | Wt 200.0 lb

## 2018-03-02 DIAGNOSIS — I10 Essential (primary) hypertension: Secondary | ICD-10-CM | POA: Diagnosis not present

## 2018-03-02 DIAGNOSIS — E039 Hypothyroidism, unspecified: Secondary | ICD-10-CM

## 2018-03-02 MED ORDER — AMLODIPINE BESYLATE 5 MG PO TABS: 5.0000 mg | ORAL_TABLET | Freq: Every day | ORAL | 1 refills | Status: DC

## 2018-03-02 MED FILL — AMLODIPINE BESYLATE 5 MG TA: 5 | 30 days supply | Qty: 30 | Fill #0

## 2018-03-02 MED FILL — AMITRIPTYLINE HCL 25 MG TAB: 25 | 30 days supply | Qty: 30 | Fill #7

## 2018-03-03 ENCOUNTER — Encounter: Payer: Self-pay | Admitting: Sports Medicine

## 2018-03-03 NOTE — Progress Notes (Signed)
   Subjective:    Patient ID: Kimberly Koch, female    DOB: 1966/03/05, 52 y.o.   MRN: [id number]  HPI chief complaint: Annual physical  Tavi comes in today for annual physical exam. She has a history of hypertension, hypercholesterolemia, and hypothyroidism. Main complaint today is elevated blood pressure. She has noticed that her blood pressure has been running a little higher than usual. She has been on Atacand for couple of years. She did not do well with HCTZ. She had a lot of cramping with this medication. She's also complaining of some fatigue. Work is very stressful for her. She is also continuing to rebuild her life after a divorce. She denies chest pain, shortness of breath, or abdominal pain. She has noticed some mild swelling in her legs but this resolves when wearing compression sleeves.  Medications reviewed Allergies reviewed    Review of Systems    as above Objective:   Physical Exam  Well-developed, well-nourished. No acute distress blood pressure is 146/100. BMI is 29.65.  HEENT: Normocephalic, atraumatic. Pupils are equal. Nares patent. Neck: No carotid bruits. No JVD. No palpable thyromegaly Cardiovascular: Regular rate. No murmurs, rubs, or gallops. Lungs: Clear to auscultation bilaterally. No rhonchi, rales, or wheezes. Abdomen: Benign Extremities: Trace edema  Laboratory data: CBC, CMP, lipid profile, and TSH from March 18 of this year are reviewed. Total cholesterol is slightly elevated at 226 but LDL remains high at 157. CBC is normal. See med is normal. TSH is within normal range.       Assessment & Plan:   Hypertension Hypercholesterolemia Hypothyroidism  Patient will discontinue her Atacand and start Norvasc 5 mg daily. Phone call follow-up in 3-4 weeks with blood pressure readings recorded by the patient. If blood pressure remains elevated then I would consider increasing her dosage to 10 mg daily. In regards to her hypercholesterolemia I would  really like to avoid starting a statin. Patient will try to be more diligent about lifestyle modifications. If her LDL increases then we may need to reconsider starting medication for this. Her hypothyroidism is stable. TSH is within normal range. Continue current dose of Synthroid. Patient also takes amitriptyline at night. Continue at current dosage. Annual screening mammogram is scheduled for later this month. Colonoscopy was done in 2016. Recommended follow-up colonoscopy in 2021.

## 2018-03-09 ENCOUNTER — Ambulatory Visit
Admission: RE | Admit: 2018-03-09 | Discharge: 2018-03-09 | Disposition: A | Discharge status: Home / Self Care | Payer: BLUE CROSS/BLUE SHIELD | Source: Ambulatory Visit | Attending: Obstetrics and Gynecology | Admitting: Obstetrics and Gynecology

## 2018-03-09 DIAGNOSIS — Z139 Encounter for screening, unspecified: Secondary | ICD-10-CM

## 2018-03-10 ENCOUNTER — Telehealth: Payer: Self-pay | Admitting: Sports Medicine

## 2018-03-10 NOTE — Telephone Encounter (Signed)
I spoke with Kimberly Koch on the phone today. Her blood pressure has been elevated since starting Norvasc 5 mg daily. Blood pressure yesterday was 149/94. Today it is 164/96. I've recommended that she take a second 5 mg dose now and skip tonight's 5 mg dose. She will then start 10 mg daily tomorrow morning and will follow-up with me again in a few days to let me know how she is doing.

## 2018-03-17 ENCOUNTER — Other Ambulatory Visit: Payer: Self-pay | Admitting: *Deleted

## 2018-03-17 ENCOUNTER — Telehealth: Payer: Self-pay | Admitting: Sports Medicine

## 2018-03-17 MED ORDER — CANDESARTAN CILEXETIL 16 MG PO TABS: 16.0000 mg | ORAL_TABLET | Freq: Every day | ORAL | 2 refills | Status: DC

## 2018-03-17 MED ORDER — LISINOPRIL 20 MG PO TABS: 20.0000 mg | ORAL_TABLET | Freq: Every day | ORAL | 1 refills | Status: DC

## 2018-03-17 MED FILL — LISINOPRIL 20 MG TABLET: 20 | 60 days supply | Qty: 60 | Fill #0

## 2018-03-17 NOTE — Telephone Encounter (Signed)
  I spoke with Kimberly Koch on the phone today. She is experiencing lower extremity pitting edema. Her blood pressure remains uncontrolled as well. Blood pressure is running 366Y systolically and in the 403K diastolically. She feels well otherwise. She is on a maximum dose of Norvasc. I think the lower extremity edema is from the calcium channel blocker. She'll discontinue the Norvasc altogether and we will start lisinopril 20 mg daily. Phone follow-up in one week with updated blood pressure reading and an update on lower extremity edema. If lower extremity edema does not resolve with cessation of Norvasc, then patient will need to return to the office for further workup.

## 2018-03-31 MED FILL — AMITRIPTYLINE HCL 25 MG TAB: 25 | 30 days supply | Qty: 30 | Fill #8

## 2018-04-07 MED FILL — LEVOTHYROXINE 50 MCG TABLET: 50 | 30 days supply | Qty: 30 | Fill #2

## 2018-04-26 MED FILL — AMITRIPTYLINE HCL 25 MG TAB: 25 | 30 days supply | Qty: 30 | Fill #9

## 2018-05-10 MED FILL — LISINOPRIL 20 MG TABLET: 20 | 60 days supply | Qty: 60 | Fill #1

## 2018-05-11 MED FILL — LEVOTHYROXINE 50 MCG TABLET: 50 | 30 days supply | Qty: 30 | Fill #3

## 2018-06-02 MED FILL — AMITRIPTYLINE HCL 25 MG TAB: 25 | 30 days supply | Qty: 30 | Fill #10

## 2018-06-02 MED FILL — tiZANidine HCL 4 MG TABS: 4 | 30 days supply | Qty: 30 | Fill #3

## 2018-06-29 ENCOUNTER — Other Ambulatory Visit: Payer: Self-pay | Admitting: Sports Medicine

## 2018-06-29 MED FILL — LEVOTHYROXINE 50 MCG TABLET: 50 | 30 days supply | Qty: 30 | Fill #4

## 2018-06-29 MED FILL — LISINOPRIL 20 MG TABLET: 20 | 60 days supply | Qty: 60 | Fill #0

## 2018-06-29 MED FILL — FLUoxetine HCL 10 MG TABS: 10 | 30 days supply | Qty: 30 | Fill #0

## 2018-06-29 MED FILL — AMITRIPTYLINE HCL 25 MG TAB: 25 | 30 days supply | Qty: 30 | Fill #11

## 2018-08-02 ENCOUNTER — Other Ambulatory Visit: Payer: Self-pay | Admitting: Sports Medicine

## 2018-08-02 MED FILL — LEVOTHYROXINE 50 MCG TABLET: 50 | 60 days supply | Qty: 60 | Fill #0

## 2018-08-02 MED FILL — AMITRIPTYLINE HCL 25 MG TAB: 25 | 90 days supply | Qty: 90 | Fill #0

## 2018-09-09 MED FILL — LISINOPRIL 20 MG TABLET: 20 | 60 days supply | Qty: 60 | Fill #1

## 2018-09-15 MED FILL — tiZANidine HCL 4 MG TABS: 4 | 30 days supply | Qty: 30 | Fill #0

## 2018-10-27 MED FILL — LEVOTHYROXINE 50 MCG TABLET: 50 | 60 days supply | Qty: 60 | Fill #1

## 2018-10-27 MED FILL — AMITRIPTYLINE HCL 25 MG TAB: 25 | 90 days supply | Qty: 90 | Fill #1

## 2018-11-08 ENCOUNTER — Other Ambulatory Visit: Payer: Self-pay | Admitting: Sports Medicine

## 2018-11-08 MED FILL — LISINOPRIL 20 MG TABLET: 20 | 60 days supply | Qty: 60 | Fill #0

## 2018-12-30 MED FILL — AZITHROMYCIN 500 MG TABLET: 500 | 5 days supply | Qty: 5 | Fill #0

## 2019-01-10 MED FILL — LISINOPRIL 20 MG TABLET: 20 | 60 days supply | Qty: 60 | Fill #1

## 2019-01-10 MED FILL — LEVOTHYROXINE 50 MCG TABLET: 50 | 60 days supply | Qty: 60 | Fill #2

## 2019-01-31 ENCOUNTER — Other Ambulatory Visit: Payer: Self-pay | Admitting: Obstetrics and Gynecology

## 2019-01-31 DIAGNOSIS — Z1231 Encounter for screening mammogram for malignant neoplasm of breast: Secondary | ICD-10-CM

## 2019-02-08 MED FILL — AMITRIPTYLINE HCL 25 MG TAB: 25 | 90 days supply | Qty: 90 | Fill #2

## 2019-02-28 MED FILL — MELOXICAM 7.5 MG TABLET: 7.5 | 30 days supply | Qty: 60 | Fill #0

## 2019-03-08 ENCOUNTER — Other Ambulatory Visit: Payer: Self-pay | Admitting: *Deleted

## 2019-03-08 MED ORDER — LEVOTHYROXINE SODIUM 50 MCG PO TABS: ORAL_TABLET | ORAL | 2 refills | Status: DC

## 2019-03-08 MED ORDER — LISINOPRIL 20 MG PO TABS: 20.0000 mg | ORAL_TABLET | Freq: Every day | ORAL | 2 refills | Status: DC

## 2019-03-08 MED ORDER — FLUOXETINE HCL 10 MG PO TABS: 10.0000 mg | ORAL_TABLET | Freq: Every day | ORAL | 2 refills | Status: DC

## 2019-03-15 ENCOUNTER — Ambulatory Visit: Payer: BLUE CROSS/BLUE SHIELD

## 2019-04-12 ENCOUNTER — Ambulatory Visit: Payer: BLUE CROSS/BLUE SHIELD

## 2019-05-10 ENCOUNTER — Other Ambulatory Visit: Payer: Self-pay | Admitting: *Deleted

## 2019-05-10 MED ORDER — AMITRIPTYLINE HCL 25 MG PO TABS: ORAL_TABLET | ORAL | 0 refills | Status: DC

## 2019-05-12 ENCOUNTER — Other Ambulatory Visit: Payer: Self-pay | Admitting: *Deleted

## 2019-05-12 DIAGNOSIS — I10 Essential (primary) hypertension: Secondary | ICD-10-CM

## 2019-05-12 DIAGNOSIS — E7800 Pure hypercholesterolemia, unspecified: Secondary | ICD-10-CM

## 2019-05-12 DIAGNOSIS — E039 Hypothyroidism, unspecified: Secondary | ICD-10-CM

## 2019-05-16 MED FILL — tiZANidine HCL 4 MG TABS: 4 | 30 days supply | Qty: 90 | Fill #0

## 2019-05-18 ENCOUNTER — Encounter: Payer: Self-pay | Admitting: Sports Medicine

## 2019-05-19 ENCOUNTER — Ambulatory Visit: Payer: Self-pay | Admitting: Sports Medicine

## 2019-06-07 ENCOUNTER — Ambulatory Visit
Admission: RE | Admit: 2019-06-07 | Discharge: 2019-06-07 | Disposition: A | Discharge status: Home / Self Care | Payer: PRIVATE HEALTH INSURANCE | Source: Ambulatory Visit | Attending: Obstetrics and Gynecology | Admitting: Obstetrics and Gynecology

## 2019-06-07 DIAGNOSIS — Z1231 Encounter for screening mammogram for malignant neoplasm of breast: Secondary | ICD-10-CM

## 2019-07-26 MED FILL — METOPROLOL SUCCINATE ER 25: 25 | 90 days supply | Qty: 90 | Fill #0

## 2019-07-26 MED FILL — FLUoxetine HCL 10 MG CAPS: 10 | 90 days supply | Qty: 90 | Fill #0

## 2019-07-26 MED FILL — ROSUVASTATIN CALCIUM 5 MG T: 5 | 90 days supply | Qty: 90 | Fill #0

## 2019-07-26 MED FILL — TRIAMCINOLONE 0.5% OINTMENT: 0.5 | 21 days supply | Qty: 30 | Fill #0

## 2019-07-29 MED FILL — AMITRIPTYLINE HCL 25 MG TAB: 25 | 90 days supply | Qty: 90 | Fill #0

## 2019-08-01 ENCOUNTER — Telehealth: Payer: Self-pay

## 2019-08-01 NOTE — Telephone Encounter (Signed)
NOTES ON FILE FROM [facility] [phone], SENT REFERRAL TO [place]

## 2019-08-26 MED FILL — tiZANidine HCL 4 MG TABS: 4 | 30 days supply | Qty: 30 | Fill #1

## 2019-09-16 MED FILL — METOPROLOL SUCCINATE ER 100: 100 | 90 days supply | Qty: 90 | Fill #0

## 2019-09-16 MED FILL — VENLAFAXINE HCL ER 37.5 MG: 37.5 | 30 days supply | Qty: 60 | Fill #0

## 2019-10-03 MED FILL — MELOXICAM 7.5 MG TABLET: 7.5 | 30 days supply | Qty: 60 | Fill #1

## 2019-10-11 ENCOUNTER — Ambulatory Visit: Payer: PRIVATE HEALTH INSURANCE | Admitting: Cardiovascular Disease

## 2019-10-11 ENCOUNTER — Other Ambulatory Visit: Payer: Self-pay

## 2019-10-11 ENCOUNTER — Encounter: Payer: Self-pay | Admitting: Cardiovascular Disease

## 2019-10-11 VITALS — BP 142/89 | HR 62 | Temp 97.2°F | Ht 68.0 in | Wt 192.6 lb

## 2019-10-11 DIAGNOSIS — Z8249 Family history of ischemic heart disease and other diseases of the circulatory system: Secondary | ICD-10-CM

## 2019-10-11 DIAGNOSIS — I10 Essential (primary) hypertension: Secondary | ICD-10-CM

## 2019-10-11 DIAGNOSIS — Z79899 Other long term (current) drug therapy: Secondary | ICD-10-CM

## 2019-10-11 DIAGNOSIS — E039 Hypothyroidism, unspecified: Secondary | ICD-10-CM | POA: Diagnosis not present

## 2019-10-11 DIAGNOSIS — E7800 Pure hypercholesterolemia, unspecified: Secondary | ICD-10-CM | POA: Diagnosis not present

## 2019-10-11 MED ORDER — ROSUVASTATIN CALCIUM 20 MG PO TABS: 20.0000 mg | ORAL_TABLET | Freq: Every day | ORAL | 6 refills | Status: DC

## 2019-10-11 MED ORDER — LISINOPRIL 40 MG PO TABS: 40.0000 mg | ORAL_TABLET | Freq: Every day | ORAL | 6 refills | Status: DC

## 2019-10-11 MED FILL — ROSUVASTATIN CALCIUM 20 MG: 20 | 30 days supply | Qty: 30 | Fill #0

## 2019-10-11 MED FILL — LISINOPRIL 40 MG TABLET: 40 | 30 days supply | Qty: 30 | Fill #0

## 2019-10-11 NOTE — Patient Instructions (Signed)
Medication Instructions:  INCREASE CRESTOR 20MG  DAILY INCREASE LISINOPRIL 40MG  DAILY If you need a refill on your cardiac medications before your next appointment, please call your pharmacy.  Labwork: FASTING LIPID PANEL AND CMET IN 6 WEEKS(10-23-2019)   HERE IN OUR OFFICE AT [facility]    You will need to fast. DO NOT EAT OR DRINK PAST MIDNIGHT.       If you have labs (blood work) drawn today and your tests are completely normal, you will receive your results only by: Marland Kitchen MyChart Message (if you have MyChart) OR . A paper copy in the mail If you have any lab test that is abnormal or we need to change your treatment, we will call you to review the results.  Testing/Procedures: Echocardiogram - Your physician has requested that you have an echocardiogram. Echocardiography is a painless test that uses sound waves to create images of your heart. It provides your doctor with information about the size and shape of your heart and how well your heart's chambers and valves are working. This procedure takes approximately one hour. There are no restrictions for this procedure. This will be performed at our [facility] [place] location - [address].  Follow-Up: IN 2 MONTHS AFTER ECHO In Person You may see Shelva Majestic, MD or one of the following Advanced Practice Providers on your designated Care Team:  Almyra Deforest, PA-C  Fabian Sharp, PA-C or  [place], [place].    At [facility], you and your health needs are our priority.  As part of our continuing mission to provide you with exceptional heart care, we have created designated Provider Care Teams.  These Care Teams include your primary Cardiologist (physician) and Advanced Practice Providers (APPs -  Physician Assistants and Nurse Practitioners) who all work together to provide you with the care you need, when you need it.  Thank you for choosing [facility] at [facility]!!

## 2019-10-11 NOTE — Progress Notes (Signed)
Cardiology Office Note    Date:  10/12/2019   ID:  KEERICA Koch, DOB 1966-06-03, MRN [id number]  PCP:  Kimberly Dials, PA-C  Cardiologist:  Shelva Majestic, MD   New cardiology evaluation referred through the courtesy of Kimberly Marlin, PA-C/Dr. Melford Koch.  History of Present Illness:  Kimberly Koch is a 53 y.o. female has a hypertension for at least 29 years and strong family history for heart disease an.  She is referred by Kimberly Marlin, PA for cardiology evaluation following recent hypertension  exacerbation.  This Loayza admits to at least a 22-year history of hypertension.  In the past she had been on numerous medications including amlodipine, ARB therapy, most recently has been on lisinopril 20 mg daily and recently was started on metoprolol succinate initially at 25 mg.  Due to continued blood pressure elevation, her Toprol-XL has been titrated to 50 mg and over the past week further titrated to 100 mg dail by Kimberly Marlin, PA. She has a history of significant hyperlipidemia with laboratory in June 2020 showing a total cholesterol of 239, LDL 162, triglycerides 171 and HDL 45.  She has been recently started on Crestor 5 mg.  She also has a history of hypothyroidism on levothyroxine 50 mcg and is on Effexor XR in addition to amitriptyline 25 mg at bedtime.  He has a significant family history for heart disease with her father dying at age 32 but having heart disease starting in his 88s will he underwent CABG revascularization in his 52s.  Her mother has a history of hyperlipidemia and due to chronic smoking has developed pulmonary fibrosis.  Maternal grandfather had heart disease and died at age 21.  She has a sister who is 12 years old.  Past medical/surgical history is also notable for bilateral TMJ surgery, bilateral ankle surgery, and nasal antrostomy x2.  Current Medications: Outpatient Medications Prior to Visit  Medication Sig Dispense Refill  . amitriptyline (ELAVIL) 25 MG  tablet TAKE 1 TABLET BY MOUTH AT BEDTIME. 90 tablet 0  . DUEXIS 800-26.6 MG TABS Take 1 tablet by mouth 3 (three) times daily as needed. for pain  3  . levothyroxine (SYNTHROID, LEVOTHROID) 50 MCG tablet Take one every morning before breakfast 90 tablet 2  . metoprolol succinate (TOPROL-XL) 100 MG 24 hr tablet Take by mouth.    . venlafaxine XR (EFFEXOR-XR) 37.5 MG 24 hr capsule Take one tablet for 7 days and then 2 tablets    . FLUoxetine (PROZAC) 10 MG tablet Take 1 tablet (10 mg total) by mouth daily. 90 tablet 2  . lisinopril (PRINIVIL,ZESTRIL) 20 MG tablet Take 1 tablet (20 mg total) by mouth daily. 90 tablet 2  . rosuvastatin (CRESTOR) 5 MG tablet rosuvastatin 5 mg tablet    . amLODipine (NORVASC) 5 MG tablet Take 1 tablet (5 mg total) by mouth daily. 30 tablet 1   No facility-administered medications prior to visit.      Allergies:   Amlodipine, Demerol [meperidine], and Percocet [oxycodone-acetaminophen]   Social History   Socioeconomic History  . Marital status: Divorced    Spouse name: Not on file  . Number of children: Not on file  . Years of education: Not on file  . Highest education level: Not on file  Occupational History  . Not on file  Social Needs  . Financial resource strain: Not on file  . Food insecurity    Worry: Not on file    Inability: Not on file  .  Transportation needs    Medical: Not on file    Non-medical: Not on file  Tobacco Use  . Smoking status: Never Smoker  . Smokeless tobacco: Never Used  Substance and Sexual Activity  . Alcohol use: Not on file  . Drug use: Not on file  . Sexual activity: Not on file  Lifestyle  . Physical activity    Days per week: Not on file    Minutes per session: Not on file  . Stress: Not on file  Relationships  . Social Herbalist on phone: Not on file    Gets together: Not on file    Attends religious service: Not on file    Active member of club or organization: Not on file    Attends meetings  of clubs or organizations: Not on file    Relationship status: Not on file  Other Topics Concern  . Not on file  Social History Narrative  . Not on file    Socially she is divorced x2 with her last divorce 5 years ago.  She lives by herself.  She is an Pensions consultant at [employer].   Family History:  The patient's family history includes Breast cancer in her maternal grandmother.  Her mother is living at age 32 and has hyperlipidemia, rheumatoid arthritis, and pulmonary fibrosis; father died at age 99 but had history of CABG revascularization, stroke and AV malformation.  A sister is living at age 36  ROS General: Negative; No fevers, chills, or night sweats;  HEENT: Negative; No changes in vision or hearing, sinus congestion, difficulty swallowing Pulmonary: Negative; No cough, wheezing, shortness of breath, hemoptysis Cardiovascular: Negative; No chest pain, presyncope, syncope, palpitations GI: Negative; No nausea, vomiting, diarrhea, or abdominal pain GU: Negative; No dysuria, hematuria, or difficulty voiding Musculoskeletal: Negative; no myalgias, joint pain, or weakness Hematologic/Oncology: Negative; no easy bruising, bleeding Endocrine: Positive for hypothyroidism Neuro: Negative; no changes in balance, headaches Skin: Negative; No rashes or skin lesions Psychiatric: Mild anxiety/depression Sleep: Negative; No snoring, daytime sleepiness, hypersomnolence, bruxism, restless legs, hypnogognic hallucinations, no cataplexy Other comprehensive 14 point system review is negative.   PHYSICAL EXAM:   VS:  BP (!) 142/89   Pulse 62   Temp (!) 97.2 F (36.2 C)   Ht 5\' 8"  (1.727 m)   Wt 192 lb 9.6 oz (87.4 kg)   SpO2 94%   BMI 29.28 kg/m     Repeat blood pressure by me was 135/84  Wt Readings from Last 3 Encounters:  10/11/19 192 lb 9.6 oz (87.4 kg)  03/02/18 200 lb (90.7 kg)  07/21/17 195 lb (88.5 kg)    General: Alert, oriented, no distress.  Skin:  normal turgor, no rashes, warm and dry HEENT: Normocephalic, atraumatic. Pupils equal round and reactive to light; sclera anicteric; extraocular muscles intact;  Nose without nasal septal hypertrophy Mouth/Parynx benign; Mallinpatti scale 3 Neck: No JVD, no carotid bruits; normal carotid upstroke Lungs: clear to ausculatation and percussion; no wheezing or rales Chest wall: without tenderness to palpitation Heart: PMI not displaced, RRR, s1 s2 normal, faint 1/6 systolic murmur, no diastolic murmur, no rubs, gallops, thrills, or heaves Abdomen: soft, nontender; no hepatosplenomehaly, BS+; abdominal aorta nontender and not dilated by palpation. Back: no CVA tenderness Pulses 2+ Musculoskeletal: full range of motion, normal strength, no joint deformities Extremities: no clubbing cyanosis or edema, Homan's sign negative  Neurologic: grossly nonfocal; Cranial nerves grossly wnl Psychologic: Normal mood and affect   Studies/Labs  Reviewed:   EKG:  EKG is ordered today.  ECG (independently read by me): Normal sinus rhythm at 64 bpm.  No ectopy.  Normal intervals.  Recent Labs: No flowsheet data found.   No flowsheet data found.  No flowsheet data found. No results found for: MCV No results found for: TSH No results found for: HGBA1C   BNP No results found for: BNP  ProBNP No results found for: PROBNP   Lipid Panel  No results found for: CHOL, TRIG, HDL, CHOLHDL, VLDL, LDLCALC, LDLDIRECT, LABVLDL   RADIOLOGY: No results found.   Additional studies/ records that were reviewed today include:  I reviewed the records of Kimberly Koch, [place] from [facility].    ASSESSMENT:    1. Essential hypertension   2. Pure hypercholesterolemia   3. Family history of heart disease   4. Hypothyroidism, unspecified type   5. 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  She has a history of hypertension of at least 22 years and has been on several types of therapy but apparently had a reaction to amlodipine and was taken off ARB therapy.  Most recently she has been on lisinopril titrated to 20 mg and when seen by Kimberly Koch in August blood pressure was elevated at 142/100.  Since that time, she has been on increasing doses of Toprol-XL which now have been titrated to 100 mg daily.  Her blood pressure today is improved but still mildly increased based on new hypertensive guidelines with optimal blood pressure being less than 100 and stage I hypertension at 130/80.  She has noted blood pressures oftentimes ranging in the upper 140s at home with diastolics in the upper 123XX123 to 90 range.  For this reason I am recommending further titration of lisinopril to 40 mg daily.  I am scheduling her to undergo a 2D echo Doppler study to assess hypertensive heart disease, systolic and diastolic function in addition to valvular architecture.  She does have significant history of hyperlipidemia.  I reviewed with her data regarding subclinical throw sclerosis and potential for plaque formation particularly with her significant LDL elevation which in 1 report was 172.  She has tolerated initiation of Crestor 5 mg.  I have recommended titration to 20 mg daily.  She also has a history of hypothyroidism for 8 years and is on levothyroxine currently at 50 mcg.  She will be having repeat laboratory next week with Kimberly Koch and I will asked that a renal function be obtained to assess her titration of lisinopril dosing.  In 6 weeks I am recommending a comprehensive metabolic panel and lipid studies with her increase statin therapy.  I will review her laboratory echocardiographic study when complete.  I will see her in the office in 2 months for reevaluation.   Medication Adjustments/Labs and Tests Ordered: Current medicines are reviewed  at length with the patient today.  Concerns regarding medicines are outlined above.  Medication changes, Labs and Tests ordered today are listed in the Patient Instructions below.  Patient Instructions  Medication Instructions:  INCREASE CRESTOR 20MG  DAILY INCREASE LISINOPRIL 40MG  DAILY If you need a refill on your cardiac medications before your next appointment, please call your pharmacy.  Labwork: FASTING LIPID PANEL AND CMET IN 6 WEEKS(10-23-2019)   HERE IN OUR OFFICE AT [facility]  You will need to fast. DO NOT EAT OR DRINK PAST MIDNIGHT.       If you have labs (blood work) drawn today and your tests are completely normal, you will receive your results only by: Marland Kitchen MyChart Message (if you have MyChart) OR . A paper copy in the mail If you have any lab test that is abnormal or we need to change your treatment, we will call you to review the results.  Testing/Procedures: Echocardiogram - Your physician has requested that you have an echocardiogram. Echocardiography is a painless test that uses sound waves to create images of your heart. It provides your doctor with information about the size and shape of your heart and how well your heart's chambers and valves are working. This procedure takes approximately one hour. There are no restrictions for this procedure. This will be performed at our [facility] location - [address].  Follow-Up: IN 2 MONTHS AFTER ECHO In Person You may see Shelva Majestic, MD or one of the following Advanced Practice Providers on your designated Care Team:  Almyra Deforest, PA-C  Fabian Sharp, PA-C or  [place], [place].    At [facility], you and your health needs are our priority.  As part of our continuing mission to provide you with exceptional heart care, we have created designated Provider Care Teams.  These Care Teams include your primary Cardiologist (physician) and Advanced Practice Providers (APPs -  Physician Assistants and Nurse Practitioners)  who all work together to provide you with the care you need, when you need it.  Thank you for choosing [facility] at [facility]!!          Signed, Shelva Majestic, MD  10/12/2019 5:27 PM    [facility] [address] [place], [place], [place]  [address] Phone: [phone]

## 2019-10-12 ENCOUNTER — Encounter: Payer: Self-pay | Admitting: Cardiovascular Disease

## 2019-10-13 MED FILL — MELOXICAM 7.5 MG TABLET: 7.5 | 30 days supply | Qty: 60 | Fill #1

## 2019-10-17 MED FILL — VENLAFAXINE HCL ER 75 MG CA: 75 | 90 days supply | Qty: 90 | Fill #0

## 2019-10-25 ENCOUNTER — Ambulatory Visit (HOSPITAL_COMMUNITY): Payer: PRIVATE HEALTH INSURANCE | Attending: Internal Medicine

## 2019-10-25 ENCOUNTER — Other Ambulatory Visit (HOSPITAL_COMMUNITY): Payer: PRIVATE HEALTH INSURANCE

## 2019-10-25 ENCOUNTER — Other Ambulatory Visit: Payer: Self-pay

## 2019-10-25 DIAGNOSIS — I10 Essential (primary) hypertension: Secondary | ICD-10-CM | POA: Insufficient documentation

## 2019-10-25 DIAGNOSIS — Z79899 Other long term (current) drug therapy: Secondary | ICD-10-CM | POA: Diagnosis present

## 2019-10-26 MED FILL — AMITRIPTYLINE HCL 25 MG TAB: 25 | 90 days supply | Qty: 90 | Fill #0

## 2019-10-27 ENCOUNTER — Other Ambulatory Visit: Payer: Self-pay

## 2019-10-27 ENCOUNTER — Ambulatory Visit (INDEPENDENT_AMBULATORY_CARE_PROVIDER_SITE_OTHER): Payer: PRIVATE HEALTH INSURANCE | Admitting: Pharmacist Clinician (PhC)/ Clinical Pharmacy Specialist

## 2019-10-27 DIAGNOSIS — I10 Essential (primary) hypertension: Secondary | ICD-10-CM | POA: Diagnosis not present

## 2019-10-27 MED ORDER — CHLORTHALIDONE 25 MG PO TABS: 25.0000 mg | ORAL_TABLET | Freq: Every day | ORAL | 1 refills | Status: DC

## 2019-10-27 MED FILL — CHLORTHALIDONE 25 MG TABS: 25 | 30 days supply | Qty: 30 | Fill #0

## 2019-10-27 NOTE — Progress Notes (Signed)
10/27/2019 Alcus Dad Jul 05, 1966 [id number]   HPI:  Kimberly Koch is a 53 y.o. female patient of Dr Claiborne Billings, with a PMH below who presents today for hypertension clinic evaluation.  Her significant medical history is listed below.  Patient notes that she has a > 20 year history of hypertension, and has tried several different medications.  It does not sound like she was ever on more than one medication at a time.  At her visit with Dr. Claiborne Billings her lisinopril was increased from 20 to 40 mg daily.    Today she is here for follow up.  She does not have a home BP cuff, but instead has co-workers check her from time to time while in the office (she works for [employer]). She admits to being a little stressed about having hypertension, as her father had ongoing problems and died early in life (45).  A recent echo showed grade 1 diastolic dysfunction.   Past Medical History: hyperlipidemia 6/20 - TC 239, TG 171, HDL 45, LDL 162 (on rosuvastatin 5 mg)  hypothyroidism 6/20 - 4.87 on synthroid 50 mcg     Blood Pressure Goal:  130/80  Previously tried:  Losartan, candesartan  Current Medications:  Lisinopril 40 mg, metoprolol succ 100 mg,  Family Hx: dad had cerebral aneurysm, CABG, strokes, died 03/27/2004 at 71; mom liiving with hyperlipidemia; 1 sister with migraines  Social Hx: no tobacco, occasional alcohol (2-3 per month); 1 coffee in am, moslty water thruoughout day, some chocolate  Diet: lost 10 pounds in past few months; nut bars, protein bars for snacks, watches sodium, mostly home cooked; sandwiches (deli lunch meat, or PBJ); venison, limits pork except occasional bacon  Exercise: very active at work, walks dog after work (75 lb)  Home BP readings: always checking at work - most readings are 130-150/80-90's.  Occasional diastolic reading > 123XX123.     Intolerances: amlodipine - edema  Labs: 6/20 Na 140, K 4.5, Glu 105, BUN 23, SCr 0.7  Wt Readings from Last 3 Encounters:   10/27/19 192 lb 9.6 oz (87.4 kg)  10/11/19 192 lb 9.6 oz (87.4 kg)  03/02/18 200 lb (90.7 kg)   BP Readings from Last 3 Encounters:  10/27/19 118/82  10/11/19 (!) 142/89  03/02/18 (!) 132/100   Pulse Readings from Last 3 Encounters:  10/27/19 65  10/11/19 62  08/03/12 90    Current Outpatient Medications  Medication Sig Dispense Refill  . amitriptyline (ELAVIL) 25 MG tablet TAKE 1 TABLET BY MOUTH AT BEDTIME. 90 tablet 0  . DUEXIS 800-26.6 MG TABS Take 1 tablet by mouth 3 (three) times daily as needed. for pain  3  . levothyroxine (SYNTHROID, LEVOTHROID) 50 MCG tablet Take one every morning before breakfast 90 tablet 2  . lisinopril (ZESTRIL) 40 MG tablet Take 1 tablet (40 mg total) by mouth daily. 30 tablet 6  . metoprolol succinate (TOPROL-XL) 100 MG 24 hr tablet Take by mouth.    . rosuvastatin (CRESTOR) 20 MG tablet Take 1 tablet (20 mg total) by mouth daily at 6 PM. 30 tablet 6  . venlafaxine XR (EFFEXOR-XR) 37.5 MG 24 hr capsule Take one tablet for 7 days and then 2 tablets    . chlorthalidone (HYGROTON) 25 MG tablet Take 1 tablet (25 mg total) by mouth daily. 30 tablet 1   No current facility-administered medications for this visit.     Allergies  Allergen Reactions  . Amlodipine Swelling  . Demerol [Meperidine]  Nausea And Vomiting  . Percocet [Oxycodone-Acetaminophen] Swelling    History reviewed. No pertinent past medical history.  Blood pressure 118/82, pulse 65, resp. rate 16, height 5\' 8"  (1.727 m), weight 192 lb 9.6 oz (87.4 kg), SpO2 96 %.  Hypertension Patient with mixed systolic/diastolic hypertension, currently on lisinopril 40 mg.   Although her BP in the office is close to goal, will be more aggressive with lowering due to diastolic dysfunction and regularly elevated diastolic readings.  Today will have her start chlorthalidone 25 mg once daily (she can start with 1/2 tablet for 4-6 days as she worries about leg cramps).  She will purchase home BP monitor,  as we need to get a better idea of resting home BP, opposed to pressure while in the office.  Will see her back in 4 weeks for follow up.  Had a long discussion about the need to use multiple medications for best BP control, and she should not be too anxious about current elevated readings, as she has only been on lisinopril and metoprolol.     Tommy Medal PharmD CPP [facility] [address] [place], [place] [address] [phone]

## 2019-10-27 NOTE — Assessment & Plan Note (Signed)
Patient with mixed systolic/diastolic hypertension, currently on lisinopril 40 mg.   Although her BP in the office is close to goal, will be more aggressive with lowering due to diastolic dysfunction and regularly elevated diastolic readings.  Today will have her start chlorthalidone 25 mg once daily (she can start with 1/2 tablet for 4-6 days as she worries about leg cramps).  She will purchase home BP monitor, as we need to get a better idea of resting home BP, opposed to pressure while in the office.  Will see her back in 4 weeks for follow up.  Had a long discussion about the need to use multiple medications for best BP control, and she should not be too anxious about current elevated readings, as she has only been on lisinopril and metoprolol.

## 2019-10-27 NOTE — Patient Instructions (Addendum)
Return for a a follow up appointment on December 15  Go to the lab on Dec 8  Your blood pressure today is 118/82  Check your blood pressure at home daily and keep record of the readings.  Take your BP meds as follows:  Start chlorthalidone. Take 1/2 tablet daily for 4-6 days then increase to 1 tablet daily.  Continue all other medications.    Bring all of your meds, your BP cuff and your record of home blood pressures to your next appointment.  Exercise as you're able, try to walk approximately 30 minutes per day.  Keep salt intake to a minimum, especially watch canned and prepared boxed foods.  Eat more fresh fruits and vegetables and fewer canned items.  Avoid eating in fast food restaurants.    HOW TO TAKE YOUR BLOOD PRESSURE: . Rest 5 minutes before taking your blood pressure. .  Don't smoke or drink caffeinated beverages for at least 30 minutes before. . Take your blood pressure before (not after) you eat. . Sit comfortably with your back supported and both feet on the floor (don't cross your legs). . Elevate your arm to heart level on a table or a desk. . Use the proper sized cuff. It should fit smoothly and snugly around your bare upper arm. There should be enough room to slip a fingertip under the cuff. The bottom edge of the cuff should be 1 inch above the crease of the elbow. . Ideally, take 3 measurements at one sitting and record the average.

## 2019-11-14 MED FILL — ROSUVASTATIN CALCIUM 20 MG: 20 | 30 days supply | Qty: 30 | Fill #1

## 2019-11-22 ENCOUNTER — Other Ambulatory Visit: Payer: Self-pay

## 2019-11-22 ENCOUNTER — Other Ambulatory Visit: Payer: PRIVATE HEALTH INSURANCE

## 2019-11-22 LAB — LIPID PANEL
Chol/HDL Ratio: 3.7 ratio (ref 0.0–4.4)
Cholesterol, Total: 175 mg/dL (ref 100–199)
HDL: 47 mg/dL (ref >39)
LDL Chol Calc (NIH): 106 mg/dL — ABNORMAL HIGH (ref 0–99)
Triglycerides: 124 mg/dL (ref 0–149)
VLDL Cholesterol Cal: 22 mg/dL (ref 5–40)

## 2019-11-22 LAB — COMPREHENSIVE METABOLIC PANEL
ALT: 20 IU/L (ref 0–32)
AST: 17 IU/L (ref 0–40)
Albumin/Globulin Ratio: 2 (ref 1.2–2.2)
Albumin: 4.5 g/dL (ref 3.8–4.9)
Alkaline Phosphatase: 99 IU/L (ref 39–117)
BUN/Creatinine Ratio: 29 — ABNORMAL HIGH (ref 9–23)
BUN: 17 mg/dL (ref 6–24)
Bilirubin Total: 0.3 mg/dL (ref 0.0–1.2)
CO2: 24 mmol/L (ref 20–29)
Calcium: 9.5 mg/dL (ref 8.7–10.2)
Chloride: 96 mmol/L (ref 96–106)
Creatinine, Ser: 0.59 mg/dL (ref 0.57–1.00)
GFR calc Af Amer: 120 mL/min/{1.73_m2} (ref >59)
GFR calc non Af Amer: 104 mL/min/{1.73_m2} (ref >59)
Globulin, Total: 2.2 g/dL (ref 1.5–4.5)
Glucose: 94 mg/dL (ref 65–99)
Potassium: 3.8 mmol/L (ref 3.5–5.2)
Sodium: 136 mmol/L (ref 134–144)
Total Protein: 6.7 g/dL (ref 6.0–8.5)

## 2019-11-29 ENCOUNTER — Other Ambulatory Visit: Payer: Self-pay

## 2019-11-29 ENCOUNTER — Ambulatory Visit (INDEPENDENT_AMBULATORY_CARE_PROVIDER_SITE_OTHER): Payer: PRIVATE HEALTH INSURANCE | Admitting: Pharmacist Clinician (PhC)/ Clinical Pharmacy Specialist

## 2019-11-29 ENCOUNTER — Encounter: Payer: Self-pay | Admitting: Pharmacist Clinician (PhC)/ Clinical Pharmacy Specialist

## 2019-11-29 DIAGNOSIS — I10 Essential (primary) hypertension: Secondary | ICD-10-CM | POA: Diagnosis not present

## 2019-11-29 MED ORDER — CHLORTHALIDONE 25 MG PO TABS: 25.0000 mg | ORAL_TABLET | Freq: Every day | ORAL | 3 refills | Status: DC

## 2019-11-29 MED FILL — CHLORTHALIDONE 25 MG TABS: 25 | 90 days supply | Qty: 90 | Fill #0

## 2019-11-29 NOTE — Progress Notes (Signed)
11/29/2019 Alcus Dad 12/06/1966 [id number]   HPI:  Kimberly Koch is a 53 y.o. female patient of Dr Claiborne Billings, with a PMH below who presents today for hypertension clinic evaluation.  Her significant medical history is listed below.  Patient notes that she has a > 20 year history of hypertension, and has tried several different medications.  It does not sound like she was ever on more than one medication at a time.  At her visit with Dr. Claiborne Billings her lisinopril was increased from 20 to 40 mg daily.  We saw her in hypertension clinic last month and added chlorthalidone 25 mg once daily.  She then had repeat labs in early December which showed no change in electrolytes or kidney function.    Today she is here for follow up.  She purchased a home BP cuff (Omron) after our last visit and has been checking numbers at home (see below).   No complaints or side effects from taking the chlorthalidone in addition to her other medications.  She also had cholesterol labs drawn, after increasing dose of rosuvastatin from 5 to 20 mg daily.  LDL dropped significantly from 162 to 106.     Past Medical History: hyperlipidemia 6/20 - TC 239, TG 171, HDL 45, LDL 162 (on rosuvastatin 5 mg 12/20 - TC 175, TG 124, HDL 47, LDL 106  (on rosuvastatin 20 mg)  hypothyroidism 6/20 - 4.87 on synthroid 50 mcg     Blood Pressure Goal:  130/80  Previously tried:  Losartan, candesartan  Current Medications:  Lisinopril 40 mg, metoprolol succ 100 mg, chlorthalidone 25 mg   Family Hx: dad had cerebral aneurysm, CABG, strokes, died 03-29-04 at 80; mom liiving with hyperlipidemia; 1 sister with migraines  Social Hx: no tobacco, occasional alcohol (2-3 per month); 1 coffee in am, moslty water thruoughout day, some chocolate  Diet: lost 10 pounds in past few months; nut bars, protein bars for snacks, watches sodium, mostly home cooked; sandwiches (deli lunch meat, or PBJ); venison, limits pork except occasional bacon;    Exercise: very active at work, walks dog after work (75 lb)  Home BP readings: 12 home readings in the past month, average 118/85.  Systolic readings all WNL, however diastolic tends to run in the low 80's with occasional readings as high as 90's.       Intolerances: amlodipine - edema  Labs: 05/2019     Na 140, K 4.5, Glu 105, BUN 23, SCr 0.7          11/2019    Na 136, K 3.8, Glu 94, Bun 17, SCr 0.59  Wt Readings from Last 3 Encounters:  11/29/19 190 lb (86.2 kg)  10/27/19 192 lb 9.6 oz (87.4 kg)  10/11/19 192 lb 9.6 oz (87.4 kg)   BP Readings from Last 3 Encounters:  11/29/19 118/80  10/27/19 118/82  10/11/19 (!) 142/89   Pulse Readings from Last 3 Encounters:  11/29/19 74  10/27/19 65  10/11/19 62    Current Outpatient Medications  Medication Sig Dispense Refill  . amitriptyline (ELAVIL) 25 MG tablet TAKE 1 TABLET BY MOUTH AT BEDTIME. 90 tablet 0  . chlorthalidone (HYGROTON) 25 MG tablet Take 1 tablet (25 mg total) by mouth daily. 90 tablet 3  . DUEXIS 800-26.6 MG TABS Take 1 tablet by mouth 3 (three) times daily as needed. for pain  3  . levothyroxine (SYNTHROID, LEVOTHROID) 50 MCG tablet Take one every morning before breakfast 90 tablet  2  . lisinopril (ZESTRIL) 40 MG tablet Take 1 tablet (40 mg total) by mouth daily. 30 tablet 6  . metoprolol succinate (TOPROL-XL) 100 MG 24 hr tablet Take by mouth.    . rosuvastatin (CRESTOR) 20 MG tablet Take 1 tablet (20 mg total) by mouth daily at 6 PM. 30 tablet 6  . venlafaxine XR (EFFEXOR-XR) 37.5 MG 24 hr capsule Take one tablet for 7 days and then 2 tablets     No current facility-administered medications for this visit.    Allergies  Allergen Reactions  . Amlodipine Swelling  . Demerol [Meperidine] Nausea And Vomiting  . Percocet [Oxycodone-Acetaminophen] Swelling    History reviewed. No pertinent past medical history.  Blood pressure 118/80, pulse 74, weight 190 lb (86.2 kg).  Hypertension Patient with diastolic  hypertension, doing much better with the addition of chlorthalidone.  She still has some diastolic readings greater than 80, but she is aware and watching these.  Will have her continue to monitor at home and follow up with Dr. Claiborne Billings in January.     Tommy Medal PharmD CPP [facility] [address] [place], [place] [address] [phone]

## 2019-11-29 NOTE — Patient Instructions (Signed)
  Your blood pressure today is 118/80  Check your blood pressure at home daily and keep record of the readings.  Take your BP meds as follows:  Continue with all current medications  Bring all of your meds, your BP cuff and your record of home blood pressures to your next appointment.  Exercise as you're able, try to walk approximately 30 minutes per day.  Keep salt intake to a minimum, especially watch canned and prepared boxed foods.  Eat more fresh fruits and vegetables and fewer canned items.  Avoid eating in fast food restaurants.    HOW TO TAKE YOUR BLOOD PRESSURE: . Rest 5 minutes before taking your blood pressure. .  Don't smoke or drink caffeinated beverages for at least 30 minutes before. . Take your blood pressure before (not after) you eat. . Sit comfortably with your back supported and both feet on the floor (don't cross your legs). . Elevate your arm to heart level on a table or a desk. . Use the proper sized cuff. It should fit smoothly and snugly around your bare upper arm. There should be enough room to slip a fingertip under the cuff. The bottom edge of the cuff should be 1 inch above the crease of the elbow. . Ideally, take 3 measurements at one sitting and record the average.

## 2019-11-29 NOTE — Assessment & Plan Note (Signed)
Patient with diastolic hypertension, doing much better with the addition of chlorthalidone.  She still has some diastolic readings greater than 80, but she is aware and watching these.  Will have her continue to monitor at home and follow up with Dr. Claiborne Billings in January.

## 2019-12-12 MED FILL — ROSUVASTATIN CALCIUM 20 MG: 20 | 30 days supply | Qty: 30 | Fill #2

## 2019-12-12 MED FILL — LISINOPRIL 40 MG TABLET: 40 | 30 days supply | Qty: 30 | Fill #1

## 2019-12-12 MED FILL — METOPROLOL SUCCINATE ER 100: 100 | 90 days supply | Qty: 90 | Fill #1

## 2019-12-13 ENCOUNTER — Other Ambulatory Visit: Payer: Self-pay | Admitting: *Deleted

## 2019-12-13 DIAGNOSIS — E7800 Pure hypercholesterolemia, unspecified: Secondary | ICD-10-CM

## 2020-01-11 ENCOUNTER — Other Ambulatory Visit: Payer: Self-pay

## 2020-01-11 ENCOUNTER — Encounter: Payer: Self-pay | Admitting: Cardiovascular Disease

## 2020-01-11 ENCOUNTER — Ambulatory Visit: Payer: PRIVATE HEALTH INSURANCE | Admitting: Cardiovascular Disease

## 2020-01-11 DIAGNOSIS — E039 Hypothyroidism, unspecified: Secondary | ICD-10-CM | POA: Diagnosis not present

## 2020-01-11 DIAGNOSIS — I10 Essential (primary) hypertension: Secondary | ICD-10-CM | POA: Diagnosis not present

## 2020-01-11 DIAGNOSIS — E7800 Pure hypercholesterolemia, unspecified: Secondary | ICD-10-CM

## 2020-01-11 DIAGNOSIS — Z8249 Family history of ischemic heart disease and other diseases of the circulatory system: Secondary | ICD-10-CM | POA: Diagnosis not present

## 2020-01-11 DIAGNOSIS — Z79899 Other long term (current) drug therapy: Secondary | ICD-10-CM

## 2020-01-11 MED ORDER — ROSUVASTATIN CALCIUM 40 MG PO TABS: 40.0000 mg | ORAL_TABLET | Freq: Every day | ORAL | 3 refills | Status: DC

## 2020-01-11 MED ORDER — CHLORTHALIDONE 25 MG PO TABS: 12.5000 mg | ORAL_TABLET | Freq: Every day | ORAL | 3 refills | Status: DC

## 2020-01-11 NOTE — Patient Instructions (Signed)
Medication Instructions:  DECREASE CHLORTHALIDONE TO 12.5MG =1/2 TABLET INCREASE ROSUVASTATIN TO 40MG  *If you need a refill on your cardiac medications before your next appointment, please call your pharmacy*  Lab Work: CMP AND LIPID IN 3-4 MONTHS PRIOR TO NEXT APPT. If you have labs (blood work) drawn today and your tests are completely normal, you will receive your results only by: Marland Kitchen MyChart Message (if you have MyChart) OR . A paper copy in the mail If you have any lab test that is abnormal or we need to change your treatment, we will call you to review the results.  Follow-Up: At [facility], you and your health needs are our priority.  As part of our continuing mission to provide you with exceptional heart care, we have created designated Provider Care Teams.  These Care Teams include your primary Cardiologist (physician) and Advanced Practice Providers (APPs -  Physician Assistants and Nurse Practitioners) who all work together to provide you with the care you need, when you need it.  Your next appointment:   3-4 month(s)  The format for your next appointment:   In Person  Provider:   Shelva Majestic, MD

## 2020-01-11 NOTE — Progress Notes (Signed)
Cardiology Office Note    Date:  01/13/2020   ID:  Kimberly Koch, DOB 11-16-1966, MRN [id number]  PCP:  Kimberly Dials, PA-C  Cardiologist:  Kimberly Majestic, MD   F/U cardiology evaluation referred through the courtesy of Kimberly Marlin, PA-C and Dr. Melford Koch.  History of Present Illness:  Kimberly Koch is a 54 y.o. female has a hypertension for at least 54 years and strong family history for heart disease an.  Kimberly Koch was referred by Kimberly Marlin, PA for cardiology evaluation following recent hypertension exacerbation.  I saw Kimberly Koch for initial evaluation on October 11, 2019.  Kimberly Koch presents for a 62-monthfollow-up evaluation.  Kimberly Koch to at least a 22-year history of hypertension.  In the past Kimberly Koch had been on numerous medications including amlodipine, ARB therapy, recently was on lisinopril 20 mg daily and recently was started on metoprolol succinate initially at 25 mg. Due to continued blood pressure elevation, Kimberly Koch Toprol-XL has been titrated to 50 mg and further titrated to 100 mg  by SMattie Marlin PA. Kimberly Koch has a history of significant hyperlipidemia with laboratory in June 2020 showing a total cholesterol of 239, LDL 162, triglycerides 171 and HDL 45.  Kimberly Koch has been recently started on Crestor 5 mg.  Kimberly Koch also has a history of hypothyroidism on levothyroxine 50 mcg and is on Effexor XR in addition to amitriptyline 25 mg at bedtime.  Kimberly Koch has a significant family history for heart disease with Kimberly Koch father dying at age 5811but having heart disease starting in his 429swill he underwent CABG revascularization in his 548s  Kimberly Koch mother has a history of hyperlipidemia and due to chronic smoking has developed pulmonary fibrosis.  Maternal grandfather had heart disease and died at age 55  Kimberly Koch has a sister who is 537years old.  When I initially saw Kimberly Koch in October 2020, Kimberly Koch blood pressures were improved but still mildly increased based on new hypertensive guidelines.  As result I recommended further  titration of lisinopril to 40 mg daily.  I scheduled Kimberly Koch for an echo Doppler study.  I also discussed with Kimberly Koch data regarding subclinical atherosclerosis with elevated LDL levels in June 2020 at 162.  At time Kimberly Koch was only on Crestor 5 mg and I recommended titration to at least 20 mg.    Kimberly Koch underwent an echo Doppler study in October 25, 2019 which showed an EF of 60 to 65% with grade 1 diastolic dysfunction.  There was mild dilation of Kimberly Koch ascending aorta at 40 mm.  There was mild aortic sclerosis without stenosis.  Kimberly Koch had normal pulmonary pressures.  Kimberly Koch was subsequently seen by Kimberly Koch Pharm.D. and hypertension clinic and chlorthalidone 25 mg was added to Kimberly Koch medical regimen.  On simvastatin 20 mg Kimberly Koch LDL dropped from 162 down to 106.  Presently, Kimberly Koch denies chest pain or shortness of breath.  Kimberly Koch has recently started to notice some episodes of lightheadedness and somewhat low blood pressure with blood pressure readings from 10 3-1 10 systolically at home.  Kimberly Koch presents for evaluation.  Past medical/surgical history is also notable for bilateral TMJ surgery, bilateral ankle surgery, and nasal antrostomy x2.  Current Medications: Outpatient Medications Prior to Visit  Medication Sig Dispense Refill   amitriptyline (ELAVIL) 25 MG tablet TAKE 1 TABLET BY MOUTH AT BEDTIME. 90 tablet 0   DUEXIS 800-26.6 MG TABS Take 1 tablet by mouth 3 (three) times daily as needed. for pain  3   levothyroxine (SYNTHROID,  LEVOTHROID) 50 MCG tablet Take one every morning before breakfast 90 tablet 2   lisinopril (ZESTRIL) 40 MG tablet Take 1 tablet (40 mg total) by mouth daily. 30 tablet 6   metoprolol succinate (TOPROL-XL) 100 MG 24 hr tablet Take by mouth.     venlafaxine XR (EFFEXOR-XR) 37.5 MG 24 hr capsule Take one tablet for 7 days and then 2 tablets     chlorthalidone (HYGROTON) 25 MG tablet Take 1 tablet (25 mg total) by mouth daily. 90 tablet 3   rosuvastatin (CRESTOR) 20 MG tablet Take 1  tablet (20 mg total) by mouth daily at 6 PM. 30 tablet 6   No facility-administered medications prior to visit.     Allergies:   Amlodipine, Demerol [meperidine], and Percocet [oxycodone-acetaminophen]   Social History   Socioeconomic History   Marital status: Divorced    Spouse name: Not on file   Number of children: Not on file   Years of education: Not on file   Highest education level: Not on file  Occupational History   Not on file  Tobacco Use   Smoking status: Never Smoker   Smokeless tobacco: Never Used  Substance and Sexual Activity   Alcohol use: Not on file   Drug use: Not on file   Sexual activity: Not on file  Other Topics Concern   Not on file  Social History Narrative   Not on file   Social Determinants of Health   Financial Resource Strain:    Difficulty of Paying Living Expenses: Not on file  Food Insecurity:    Worried About Kimberly Koch in the Last Year: Not on file   Ran Out of Food in the Last Year: Not on file  Transportation Needs:    Lack of Transportation (Medical): Not on file   Lack of Transportation (Non-Medical): Not on file  Physical Activity:    Days of Exercise per Week: Not on file   Minutes of Exercise per Session: Not on file  Stress:    Feeling of Stress : Not on file  Social Connections:    Frequency of Communication with Friends and Family: Not on file   Frequency of Social Gatherings with Friends and Family: Not on file   Attends Religious Services: Not on file   Active Member of Clubs or Organizations: Not on file   Attends Archivist Meetings: Not on file   Marital Status: Not on file    Socially Kimberly Koch is divorced x2 with Kimberly Koch last divorce 5 years ago.  Kimberly Koch lives by herself.  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   Family History:  The patient's family history includes Breast cancer in Kimberly Koch maternal grandmother.  Kimberly Koch mother is living at age 21 and has  hyperlipidemia, rheumatoid arthritis, and pulmonary fibrosis; father died at age 5 but had history of CABG revascularization, stroke and AV malformation.  A sister is living at age 84  ROS General: Negative; No fevers, chills, or night sweats;  HEENT: Negative; No changes in vision or hearing, sinus congestion, difficulty swallowing Pulmonary: Negative; No cough, wheezing, shortness of breath, hemoptysis Cardiovascular: Negative; No chest pain, presyncope, syncope, palpitations GI: Negative; No nausea, vomiting, diarrhea, or abdominal pain GU: Negative; No dysuria, hematuria, or difficulty voiding Musculoskeletal: Negative; no myalgias, joint pain, or weakness Hematologic/Oncology: Negative; no easy bruising, bleeding Endocrine: Positive for hypothyroidism Neuro: Negative; no changes in balance, headaches Skin: Negative; No rashes or skin lesions Psychiatric: Mild anxiety/depression Sleep:  Negative; No snoring, daytime sleepiness, hypersomnolence, bruxism, restless legs, hypnogognic hallucinations, no cataplexy Other comprehensive 14 point system review is negative.   PHYSICAL EXAM:   VS:  BP 122/72    Pulse 69    Temp 98.1 F (36.7 C)    Ht '5\' 8"'  (1.727 m)    Wt 195 lb 3.2 oz (88.5 kg)    SpO2 94%    BMI 29.68 kg/m     Repeat blood pressure by me was 104/68 supine and 104/66 standing.  Wt Readings from Last 3 Encounters:  01/11/20 195 lb 3.2 oz (88.5 kg)  11/29/19 190 lb (86.2 kg)  10/27/19 192 lb 9.6 oz (87.4 kg)    General: Alert, oriented, no distress.  Skin: normal turgor, no rashes, warm and dry HEENT: Normocephalic, atraumatic. Pupils equal round and reactive to light; sclera anicteric; extraocular muscles intact; Nose without nasal septal hypertrophy Mouth/Parynx benign; Mallinpatti scale 3 Neck: No JVD, no carotid bruits; normal carotid upstroke Lungs: clear to ausculatation and percussion; no wheezing or rales Chest wall: without tenderness to palpitation Heart: PMI  not displaced, RRR, s1 s2 normal, 1/6 systolic murmur, no diastolic murmur, no rubs, gallops, thrills, or heaves Abdomen: soft, nontender; no hepatosplenomehaly, BS+; abdominal aorta nontender and not dilated by palpation. Back: no CVA tenderness Pulses 2+ Musculoskeletal: full range of motion, normal strength, no joint deformities Extremities: no clubbing cyanosis or edema, Homan's sign negative  Neurologic: grossly nonfocal; Cranial nerves grossly wnl Psychologic: Normal mood and affect   Studies/Labs Reviewed:   EKG:  EKG is ordered today.  ECG (independently read by me): Normal sinus rhythm at 69 bpm.  No ectopy.  Normal intervals  October 11, 2019 ECG (independently read by me): Normal sinus rhythm at 64 bpm.  No ectopy.  Normal intervals.  Recent Labs: BMP Latest Ref Rng & Units 11/22/2019  Glucose 65 - 99 mg/dL 94  BUN 6 - 24 mg/dL 17  Creatinine 0.57 - 1.00 mg/dL 0.59  BUN/Creat Ratio 9 - 23 29(H)  Sodium 134 - 144 mmol/L 136  Potassium 3.5 - 5.2 mmol/L 3.8  Chloride 96 - 106 mmol/L 96  CO2 20 - 29 mmol/L 24  Calcium 8.7 - 10.2 mg/dL 9.5     Hepatic Function Latest Ref Rng & Units 11/22/2019  Total Protein 6.0 - 8.5 g/dL 6.7  Albumin 3.8 - 4.9 g/dL 4.5  AST 0 - 40 IU/L 17  ALT 0 - 32 IU/L 20  Alk Phosphatase 39 - 117 IU/L 99  Total Bilirubin 0.0 - 1.2 mg/dL 0.3    No flowsheet data found. No results found for: MCV No results found for: TSH No results found for: HGBA1C   BNP No results found for: BNP  ProBNP No results found for: PROBNP   Lipid Panel     Component Value Date/Time   CHOL 175 11/22/2019 0936   TRIG 124 11/22/2019 0936   HDL 47 11/22/2019 0936   CHOLHDL 3.7 11/22/2019 0936   LDLCALC 106 (H) 11/22/2019 0936   LABVLDL 22 11/22/2019 0936     RADIOLOGY: No results found.   Additional studies/ records that were reviewed today include:  I reviewed the records of Kimberly Koch, [place] from [facility].    ASSESSMENT:    1. Essential hypertension   2. Pure hypercholesterolemia   3. Hypothyroidism, unspecified type   4. Family history of heart disease   5. 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  Kimberly Koch has a history of hypertension of at least 22 years and has been on several types of therapy but apparently had a reaction to amlodipine and was taken off ARB therapy.  When I had initially seen Kimberly Koch, Kimberly Koch was on lisinopril 20 mg and Toprol-XL had been initiated and titrated up to 100 mg by Kimberly Koch.  During that initial evaluation I recommended further titration of lisinopril to 40 mg with continued blood pressure elevation.  Kimberly Koch echo Doppler study demonstrates normal systolic function with EF at 60 to 65% and grade 1 diastolic dysfunction.  There was evidence for mild aortic valve sclerosis and mild dilatation of ascending aorta measuring 40 mm.  I had a long discussion with Kimberly Koch regarding subclinical atherosclerosis and with LDL cholesterol in June 2020 at 162 for which Kimberly Koch was started on Crestor 5 mg I recommended further titration to 20 mg.  Lipid studies have improved on therapy to 106 and I again have discussed with Kimberly Koch that plaque can still form at this level.  As result I have recommended further titration today up to 40 mg of rosuvastatin.  Kimberly Koch had been started on chlorthalidone by Joslyn Koch, Pharm.D. and blood pressure today remains on the low side.  Kimberly Koch has noticed some symptoms of lightheadedness.  As result I have recommended reduction of chlorthalidone to 12.5 mg daily.  Kimberly Koch will continue to monitor blood pressure at home and this remains low discontinuance of chlorthalidone may be necessary.  Kimberly Koch continues to be on levothyroxine 50 mcg for hypothyroidism.  Kimberly Koch was recently started on Effexor XR and I discussed with Kimberly Koch  that this can have some potential orthostatic hypotension effects.  In 3 months I am recommending follow-up chemistry profile and lipid studies. I will see Kimberly Koch in office for follow-up evaluation and further recommendations will be made at that time.  Medication Adjustments/Labs and Tests Ordered: Current medicines are reviewed at length with the patient today.  Concerns regarding medicines are outlined above.  Medication changes, Labs and Tests ordered today are listed in the Patient Instructions below.  Patient Instructions  Medication Instructions:  DECREASE CHLORTHALIDONE TO 12.5MG=1/2 TABLET INCREASE ROSUVASTATIN TO 40MG *If you need a refill on your cardiac medications before your next appointment, please call your pharmacy*  Lab Work: CMP AND LIPID IN 3-4 MONTHS PRIOR TO NEXT APPT. If you have labs (blood work) drawn today and your tests are completely normal, you will receive your results only by:  [place] (if you have MyChart) OR  A paper copy in the mail If you have any lab test that is abnormal or we need to change your treatment, we will call you to review the results.  Follow-Up: At [facility], you and your health needs are our priority.  As part of our continuing mission to provide you with exceptional heart care, we have created designated Provider Care Teams.  These Care Teams include your primary Cardiologist (physician) and Advanced Practice Providers (APPs -  Physician Assistants and Nurse Practitioners) who all work together to provide you with the care you need, when you need it.  Your next appointment:   3-4 month(s)  The format for your next appointment:   In Person  Provider:   Shelva Majestic, MD     Signed, Kimberly Majestic, MD  01/13/2020 1:33 PM    [facility] [address], [address], [place], [place]  [address] Phone: [phone]

## 2020-01-12 MED FILL — ROSUVASTATIN CALCIUM 40 MG: 40 | 90 days supply | Qty: 90 | Fill #0

## 2020-01-13 ENCOUNTER — Encounter: Payer: Self-pay | Admitting: Cardiovascular Disease

## 2020-01-13 MED FILL — VENLAFAXINE HCL ER 75 MG CA: 75 | 90 days supply | Qty: 90 | Fill #0

## 2020-01-13 MED FILL — AMITRIPTYLINE HCL 25 MG TAB: 25 | 90 days supply | Qty: 90 | Fill #0

## 2020-01-13 MED FILL — LISINOPRIL 40 MG TABLET: 40 | 30 days supply | Qty: 30 | Fill #2

## 2020-01-13 NOTE — Addendum Note (Signed)
Addended by: Hinton Dyer on: 01/13/2020 01:46 PM   Modules accepted: Orders

## 2020-01-19 MED FILL — MELOXICAM 7.5 MG TABLET: 7.5 | 30 days supply | Qty: 60 | Fill #2

## 2020-02-07 MED FILL — LEVOTHYROXINE 50 MCG TABLET: 50 | 90 days supply | Qty: 90 | Fill #0

## 2020-02-14 MED FILL — LISINOPRIL 40 MG TABLET: 40 | 30 days supply | Qty: 30 | Fill #3

## 2020-03-13 MED FILL — LISINOPRIL 40 MG TABLET: 40 | 30 days supply | Qty: 30 | Fill #4

## 2020-03-14 MED FILL — METOPROLOL SUCCINATE ER 100: 100 | 90 days supply | Qty: 90 | Fill #0

## 2020-04-04 MED FILL — MUPIROCIN 2% OINTMENT: 2 | 7 days supply | Qty: 22 | Fill #0

## 2020-04-13 ENCOUNTER — Encounter: Payer: Self-pay | Admitting: Cardiovascular Disease

## 2020-04-16 MED FILL — LISINOPRIL 40 MG TABS: 40 | 30 days supply | Qty: 30 | Fill #5

## 2020-04-16 NOTE — Telephone Encounter (Signed)
Sent to primary as Juluis Rainier

## 2020-04-17 ENCOUNTER — Other Ambulatory Visit: Payer: Self-pay

## 2020-04-17 ENCOUNTER — Encounter: Payer: Self-pay | Admitting: Cardiovascular Disease

## 2020-04-17 ENCOUNTER — Ambulatory Visit: Payer: PRIVATE HEALTH INSURANCE | Admitting: Cardiovascular Disease

## 2020-04-17 ENCOUNTER — Other Ambulatory Visit: Payer: Self-pay | Admitting: Cardiovascular Disease

## 2020-04-17 VITALS — BP 120/82 | HR 59 | Temp 96.9°F | Ht 68.0 in | Wt 193.0 lb

## 2020-04-17 DIAGNOSIS — E039 Hypothyroidism, unspecified: Secondary | ICD-10-CM | POA: Diagnosis not present

## 2020-04-17 DIAGNOSIS — I10 Essential (primary) hypertension: Secondary | ICD-10-CM

## 2020-04-17 DIAGNOSIS — Z79899 Other long term (current) drug therapy: Secondary | ICD-10-CM

## 2020-04-17 DIAGNOSIS — E7800 Pure hypercholesterolemia, unspecified: Secondary | ICD-10-CM | POA: Diagnosis not present

## 2020-04-17 DIAGNOSIS — Z8249 Family history of ischemic heart disease and other diseases of the circulatory system: Secondary | ICD-10-CM | POA: Diagnosis not present

## 2020-04-17 MED ORDER — EZETIMIBE 10 MG PO TABS: 10.0000 mg | ORAL_TABLET | Freq: Every day | ORAL | 3 refills | Status: DC

## 2020-04-17 MED FILL — EZETIMIBE 10 MG TABS: 10 | 90 days supply | Qty: 90 | Fill #0

## 2020-04-17 NOTE — Progress Notes (Signed)
Cardiology Office Note    Date:  04/17/2020   ID:  Kimberly Koch, DOB 1966-10-19, MRN [id number]  PCP:  Aura Dials, PA-C  Cardiologist:  Shelva Majestic, MD   F/U cardiology evaluation referred through the courtesy of Mattie Marlin, PA-C and Dr. Melford Aase.  History of Present Illness:  Kimberly Koch is a 54 y.o. female has a hypertension for at least 35 years and strong family history for heart disease an.  She was referred by Mattie Marlin, PA for cardiology evaluation following recent hypertension exacerbation.  I saw her for initial evaluation on October 11, 2019 and  last saw her in January 2021.She presents for a 4 month follow-up evaluation.  Kimberly Koch admits to at least a 22-year history of hypertension.  In the past she had been on numerous medications including amlodipine, ARB therapy, recently was on lisinopril 20 mg daily and recently was started on metoprolol succinate initially at 25 mg. Due to continued blood pressure elevation, her Toprol-XL has been titrated to 50 mg and further titrated to 100 mg  by Mattie Marlin, PA. She has a history of significant hyperlipidemia with laboratory in June 2020 showing a total cholesterol of 239, LDL 162, triglycerides 171 and HDL 45.  She has been recently started on Crestor 5 mg.  She also has a history of hypothyroidism on levothyroxine 50 mcg and is on Effexor XR in addition to amitriptyline 25 mg at bedtime.  She has a significant family history for heart disease with her father dying at age 36 but having heart disease starting in his 18s will he underwent CABG revascularization in his 33s.  Her mother has a history of hyperlipidemia and due to chronic smoking has developed pulmonary fibrosis.  Maternal grandfather had heart disease and died at age 66.  She has a sister who is 67 years old.  When I initially saw her in October 2020, her blood pressures were improved but still mildly increased based on new hypertensive guidelines.  As  result I recommended further titration of lisinopril to 40 mg daily.  I scheduled her for an echo Doppler study.  I also discussed with her data regarding subclinical atherosclerosis with elevated LDL levels in June 2020 at 162.  At time she was only on Crestor 5 mg and I recommended titration to at least 20 mg.    She underwent an echo Doppler study in October 25, 2019 which showed an EF of 60 to 65% with grade 1 diastolic dysfunction.  There was mild dilation of her ascending aorta at 40 mm.  There was mild aortic sclerosis without stenosis.  She had normal pulmonary pressures.  She was subsequently seen by Joslyn Hy, Pharm.D. and hypertension clinic and chlorthalidone 25 mg was added to her medical regimen.  On simvastatin 20 mg her LDL dropped from 162 down to 106.  I last saw her on January 11, 2020 at which time she remained stable and denied chest pain or shortness of breath.  She began to notice episodes of lightheadedness and low blood pressure.  During that evaluation I recommended that she reduce her chlorthalidone from 25 mg down to 12.5 mg daily.  She continues to be on levothyroxine for hypothyroidism.  She also had recently been started on Effexor XR and I discussed with her that this can also have some potential orthostatic hypotensive effects.  Since I last saw her, she underwent follow-up laboratory on April 13, 2020.  Total cholesterol was 204 HDL  50 LDL 132.  She has been taking rosuvastatin at 20 mg instead of 40 mg and has tolerated this better due to some aches at the 40 mg dose.  She denies chest pain.  She denies palpitations.  She presents for reevaluation.   Past medical/surgical history is also notable for bilateral TMJ surgery, bilateral ankle surgery, and nasal antrostomy x2.  Current Medications: Outpatient Medications Prior to Visit  Medication Sig Dispense Refill  . amitriptyline (ELAVIL) 25 MG tablet TAKE 1 TABLET BY MOUTH AT BEDTIME. 90 tablet 0  .  amitriptyline (ELAVIL) 25 MG tablet TAKE 1 TABLET BY MOUTH AT BEDTIME    . DUEXIS 800-26.6 MG TABS Take 1 tablet by mouth 3 (three) times daily as needed. for pain  3  . levothyroxine (SYNTHROID, LEVOTHROID) 50 MCG tablet Take one every morning before breakfast 90 tablet 2  . lisinopril (ZESTRIL) 40 MG tablet Take 1 tablet (40 mg total) by mouth daily. 30 tablet 6  . metoprolol succinate (TOPROL-XL) 100 MG 24 hr tablet Take by mouth.    . rosuvastatin (CRESTOR) 40 MG tablet Take 1 tablet (40 mg total) by mouth daily at 6 PM. 90 tablet 3  . venlafaxine XR (EFFEXOR-XR) 75 MG 24 hr capsule Take by mouth.    . chlorthalidone (HYGROTON) 25 MG tablet Take 0.5 tablets (12.5 mg total) by mouth daily. 45 tablet 3  . venlafaxine XR (EFFEXOR-XR) 37.5 MG 24 hr capsule Take one tablet for 7 days and then 2 tablets     No facility-administered medications prior to visit.     Allergies:   Amlodipine, Demerol [meperidine], and Percocet [oxycodone-acetaminophen]   Social History   Socioeconomic History  . Marital status: Divorced    Spouse name: Not on file  . Number of children: Not on file  . Years of education: Not on file  . Highest education level: Not on file  Occupational History  . Not on file  Tobacco Use  . Smoking status: Never Smoker  . Smokeless tobacco: Never Used  Substance and Sexual Activity  . Alcohol use: Not on file  . Drug use: Not on file  . Sexual activity: Not on file  Other Topics Concern  . Not on file  Social History Narrative  . Not on file   Social Determinants of Health   Financial Resource Strain:   . Difficulty of Paying Living Expenses:   Food Insecurity:   . Worried About Charity fundraiser in the Last Year:   . Arboriculturist in the Last Year:   Transportation Needs:   . Film/video editor (Medical):   Marland Kitchen Lack of Transportation (Non-Medical):   Physical Activity:   . Days of Exercise per Week:   . Minutes of Exercise per Session:   Stress:   .  Feeling of Stress :   Social Connections:   . Frequency of Communication with Friends and Family:   . Frequency of Social Gatherings with Friends and Family:   . Attends Religious Services:   . Active Member of Clubs or Organizations:   . Attends Archivist Meetings:   Marland Kitchen Marital Status:     Socially she is divorced x2 with her last divorce 5 years ago.  She lives by herself.  She is an Pensions consultant at [employer].   Family History:  The patient's family history includes Breast cancer in her maternal grandmother.  Her mother is living at age 49 and has hyperlipidemia,  rheumatoid arthritis, and pulmonary fibrosis; father died at age 3 but had history of CABG revascularization, stroke and AV malformation.  A sister is living at age 82  ROS General: Negative; No fevers, chills, or night sweats;  HEENT: Negative; No changes in vision or hearing, sinus congestion, difficulty swallowing Pulmonary: Negative; No cough, wheezing, shortness of breath, hemoptysis Cardiovascular: Negative; No chest pain, presyncope, syncope, palpitations GI: Negative; No nausea, vomiting, diarrhea, or abdominal pain GU: Negative; No dysuria, hematuria, or difficulty voiding Musculoskeletal: Negative; no myalgias, joint pain, or weakness Hematologic/Oncology: Negative; no easy bruising, bleeding Endocrine: Positive for hypothyroidism Neuro: Negative; no changes in balance, headaches Skin: Negative; No rashes or skin lesions Psychiatric: Mild anxiety/depression Sleep: Negative; No snoring, daytime sleepiness, hypersomnolence, bruxism, restless legs, hypnogognic hallucinations, no cataplexy Other comprehensive 14 point system review is negative.   PHYSICAL EXAM:   VS:  BP 120/82   Pulse (!) 59   Temp (!) 96.9 F (36.1 C)   Ht '5\' 8"'$  (1.727 m)   Wt 193 lb (87.5 kg)   SpO2 91%   BMI 29.35 kg/m     Repeat blood pressure by me was 140/76 supine and 108/76  Wt Readings from Last 3  Encounters:  04/17/20 193 lb (87.5 kg)  01/11/20 195 lb 3.2 oz (88.5 kg)  11/29/19 190 lb (86.2 kg)    General: Alert, oriented, no distress.  Skin: normal turgor, no rashes, warm and dry HEENT: Normocephalic, atraumatic. Pupils equal round and reactive to light; sclera anicteric; extraocular muscles intact; Nose without nasal septal hypertrophy Mouth/Parynx benign; Mallinpatti scale 3 Neck: No JVD, no carotid bruits; normal carotid upstroke Lungs: clear to ausculatation and percussion; no wheezing or rales Chest wall: without tenderness to palpitation Heart: PMI not displaced, RRR, s1 s2 normal, 1/6 systolic murmur, no diastolic murmur, no rubs, gallops, thrills, or heaves Abdomen: soft, nontender; no hepatosplenomehaly, BS+; abdominal aorta nontender and not dilated by palpation. Back: no CVA tenderness Pulses 2+ Musculoskeletal: full range of motion, normal strength, no joint deformities Extremities: no clubbing cyanosis or edema, Homan's sign negative  Neurologic: grossly nonfocal; Cranial nerves grossly wnl Psychologic: Normal mood and affect   Studies/Labs Reviewed:   EKG:  EKG is ordered today.  ECG (independently read by me): Sinus bradycardia 59 bpm.  No ectopy.  Normal intervals.  January 11, 2020 ECG (independently read by me): Normal sinus rhythm at 69 bpm.  No ectopy.  Normal intervals  October 11, 2019 ECG (independently read by me): Normal sinus rhythm at 64 bpm.  No ectopy.  Normal intervals.  Recent Labs: BMP Latest Ref Rng & Units 11/22/2019  Glucose 65 - 99 mg/dL 94  BUN 6 - 24 mg/dL 17  Creatinine 0.57 - 1.00 mg/dL 0.59  BUN/Creat Ratio 9 - 23 29(H)  Sodium 134 - 144 mmol/L 136  Potassium 3.5 - 5.2 mmol/L 3.8  Chloride 96 - 106 mmol/L 96  CO2 20 - 29 mmol/L 24  Calcium 8.7 - 10.2 mg/dL 9.5     Hepatic Function Latest Ref Rng & Units 11/22/2019  Total Protein 6.0 - 8.5 g/dL 6.7  Albumin 3.8 - 4.9 g/dL 4.5  AST 0 - 40 IU/L 17  ALT 0 - 32 IU/L 20  Alk  Phosphatase 39 - 117 IU/L 99  Total Bilirubin 0.0 - 1.2 mg/dL 0.3    No flowsheet data found. No results found for: MCV No results found for: TSH No results found for: HGBA1C   BNP No results found for: BNP  ProBNP No results found for: PROBNP   Lipid Panel     Component Value Date/Time   CHOL 175 11/22/2019 0936   TRIG 124 11/22/2019 0936   HDL 47 11/22/2019 0936   CHOLHDL 3.7 11/22/2019 0936   LDLCALC 106 (H) 11/22/2019 0936   LABVLDL 22 11/22/2019 0936     RADIOLOGY: No results found.   Additional studies/ records that were reviewed today include:  I reviewed the records of Mattie Marlin, [place] from [facility].    ASSESSMENT:    No diagnosis found.   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  She has a history of hypertension of at least 22 years and has been on several types of therapy but apparently had a reaction to amlodipine and was taken off ARB therapy.  When I initially saw her, she was on lisinopril 20 mg and Toprol-XL had been initiated and titrated up to 100 mg by Mattie Marlin.  During that initial evaluation I recommended further titration of lisinopril to 40 mg with continued blood pressure elevation.  Her echo Doppler study demonstrates normal systolic function with EF at 60 to 65% and grade 1 diastolic dysfunction.  There was evidence for mild aortic valve sclerosis and mild dilatation of ascending aorta measuring 40 mm.  I had a long discussion with her regarding subclinical atherosclerosis and with LDL cholesterol in June 2020 at 162 for which she was started on Crestor 5 mg I recommended further titration to 20 mg.  Lipid studies have improved on therapy to 106 and I again have discussed with her that plaque can still form at this level.  As result I have recommended further titration  to 40 mg of  rosuvastatin.  Apparently, she felt some foot discomfort with a 40 mg dose and is now back on 20 mg.  Her most recent lipid panel now shows an improvement.  I have recommended a addition of Zetia 10 mg to her current rosuvastatin 20 mg dose.  I discussed with her the mechanism of action and that this should induce at least a 20% further reduction in her LDL above her statin dose.  When I last saw her, I reduced her chlorthalidone.  Her blood pressure today continues to be somewhat low.  There are no signs of improvement.  I have recommended she completely discontinue chlorthalidone and she will continue lisinopril 40 mg daily in addition to metoprolol succinate 100 mg.  She continues to be on Effexor XR 75 mg again discussed with her that this medication at times can contribute to orthostatic hypotension.  She continues to be on levothyroxine for hypothyroidism.  I have recommended follow-up laboratory with a chemistry and fasting lipid studies in 4 months.  I will see her in follow-up and further recommendations will be made at that time.    Medication Adjustments/Labs and Tests Ordered: Current medicines are reviewed at length with the patient today.  Concerns regarding medicines are outlined above.  Medication changes, Labs and Tests ordered today are listed in the Patient Instructions below.  There are no Patient Instructions on file for this visit.   Signed, Shelva Majestic, MD  04/17/2020 [facility] [address] [place], [place], [place]  [address] Phone: [phone]

## 2020-04-17 NOTE — Patient Instructions (Signed)
Medication Instructions:  STOP TAKING YOUR CHLORTHALIDONE  BEING TAKING ZETIA 10MG  DAILY  *If you need a refill on your cardiac medications before your next appointment, please call your pharmacy*   Lab Work: IN 4 MONTHS FASTING LABS: CMET LIPID  If you have labs (blood work) drawn today and your tests are completely normal, you will receive your results only by: Marland Kitchen MyChart Message (if you have MyChart) OR . A paper copy in the mail If you have any lab test that is abnormal or we need to change your treatment, we will call you to review the results.   Follow-Up: At [facility], you and your health needs are our priority.  As part of our continuing mission to provide you with exceptional heart care, we have created designated Provider Care Teams.  These Care Teams include your primary Cardiologist (physician) and Advanced Practice Providers (APPs -  Physician Assistants and Nurse Practitioners) who all work together to provide you with the care you need, when you need it.  We recommend signing up for the patient portal called "MyChart".  Sign up information is provided on this After Visit Summary.  MyChart is used to connect with patients for Virtual Visits (Telemedicine).  Patients are able to view lab/test results, encounter notes, upcoming appointments, etc.  Non-urgent messages can be sent to your provider as well.   To learn more about what you can do with MyChart, go to [email or web address].    Your next appointment:   5 month(s)  The format for your next appointment:   In Person  Provider:   Shelva Majestic, MD

## 2020-04-19 ENCOUNTER — Encounter: Payer: Self-pay | Admitting: Cardiovascular Disease

## 2020-04-29 ENCOUNTER — Other Ambulatory Visit (HOSPITAL_COMMUNITY): Payer: Self-pay | Admitting: Sports Medicine

## 2020-04-30 MED FILL — tiZANidine HCL 4 MG TABS: 4 | 30 days supply | Qty: 90 | Fill #1

## 2020-04-30 MED FILL — MELOXICAM 15 MG TABLET: 15 | 90 days supply | Qty: 90 | Fill #0

## 2020-05-18 MED FILL — LISINOPRIL 40 MG TABS: 40 | 30 days supply | Qty: 30 | Fill #6

## 2020-05-18 MED FILL — LEVOTHYROXINE 50 MCG TABLET: 50 | 90 days supply | Qty: 90 | Fill #1

## 2020-06-22 ENCOUNTER — Other Ambulatory Visit: Payer: Self-pay | Admitting: Cardiovascular Disease

## 2020-06-22 MED FILL — METOPROLOL SUCCINATE ER 100: 100 | 90 days supply | Qty: 90 | Fill #1

## 2020-06-25 ENCOUNTER — Other Ambulatory Visit: Payer: Self-pay | Admitting: Cardiovascular Disease

## 2020-06-25 MED FILL — LISINOPRIL 40 MG TABS: 40 | 30 days supply | Qty: 30 | Fill #0

## 2020-06-25 NOTE — Telephone Encounter (Signed)
Rx(s) sent to pharmacy electronically.  

## 2020-06-27 ENCOUNTER — Other Ambulatory Visit: Payer: Self-pay | Admitting: Pharmacist Clinician (PhC)/ Clinical Pharmacy Specialist

## 2020-06-28 ENCOUNTER — Other Ambulatory Visit: Payer: Self-pay | Admitting: Cardiovascular Disease

## 2020-06-28 MED ORDER — CARVEDILOL 12.5 MG PO TABS: 12.5000 mg | ORAL_TABLET | Freq: Two times a day (BID) | ORAL | 3 refills | Status: DC

## 2020-06-28 MED FILL — CARVEDILOL 12.5 MG TABLET: 12.5 | 30 days supply | Qty: 60 | Fill #0

## 2020-07-12 ENCOUNTER — Other Ambulatory Visit: Payer: Self-pay | Admitting: Obstetrics and Gynecology

## 2020-07-12 DIAGNOSIS — Z1231 Encounter for screening mammogram for malignant neoplasm of breast: Secondary | ICD-10-CM

## 2020-07-13 MED FILL — EZETIMIBE 10 MG TABS: 10 | 90 days supply | Qty: 90 | Fill #1

## 2020-07-24 ENCOUNTER — Ambulatory Visit
Admission: RE | Admit: 2020-07-24 | Discharge: 2020-07-24 | Disposition: A | Discharge status: Home / Self Care | Payer: PRIVATE HEALTH INSURANCE | Source: Ambulatory Visit | Attending: Obstetrics and Gynecology | Admitting: Obstetrics and Gynecology

## 2020-07-24 ENCOUNTER — Other Ambulatory Visit: Payer: Self-pay

## 2020-07-24 DIAGNOSIS — Z1231 Encounter for screening mammogram for malignant neoplasm of breast: Secondary | ICD-10-CM

## 2020-07-24 MED FILL — CARVEDILOL 12.5 MG TABLET: 12.5 | 30 days supply | Qty: 60 | Fill #1

## 2020-07-24 MED FILL — AMITRIPTYLINE HCL 25 MG TAB: 25 | 90 days supply | Qty: 90 | Fill #0

## 2020-07-24 MED FILL — ROSUVASTATIN CALCIUM 40 MG: 40 | 90 days supply | Qty: 90 | Fill #1

## 2020-07-24 MED FILL — VENLAFAXINE HCL ER 75 MG CA: 75 | 90 days supply | Qty: 90 | Fill #0

## 2020-08-21 MED FILL — LEVOTHYROXINE 50 MCG TABLET: 50 | 30 days supply | Qty: 30 | Fill #0

## 2020-08-21 MED FILL — CARVEDILOL 12.5 MG TABLET: 12.5 | 30 days supply | Qty: 60 | Fill #2

## 2020-08-22 ENCOUNTER — Ambulatory Visit: Payer: PRIVATE HEALTH INSURANCE | Admitting: Pharmacist Clinician (PhC)/ Clinical Pharmacy Specialist

## 2020-08-22 ENCOUNTER — Other Ambulatory Visit: Payer: Self-pay

## 2020-08-22 DIAGNOSIS — I10 Essential (primary) hypertension: Secondary | ICD-10-CM

## 2020-08-22 MED FILL — CHLORTHALIDONE 25 MG TABS: 25 | 90 days supply | Qty: 90 | Fill #1

## 2020-08-22 NOTE — Progress Notes (Signed)
08/22/2020 Kimberly Koch 27-Nov-1966 [id number]   HPI:  Kimberly Koch is a 54 y.o. female patient of Dr Claiborne Billings, with a PMH below who presents today for hypertension clinic follow up.  Patient notes that she has a > 20 year history of hypertension, and has tried several different medications.  It does not sound like she was ever on more than one medication at a time.  When we saw her in CV[facility] in 03-25-2019 she was on metoprolol and lisinopril.  We added chlorthalidone 12.5 mg to bring her pressure down to goal.  She then saw Dr. Claiborne Billings in May, and he stopped this, due to some issues with orthostatic hypotension.  He was unsure if it was due to the addition of chlorthalidone, her ongoing prescription for Effexor, or a combination of the two.   Also, she switched metoprolol to carvedilol, due to some extreme dreams.  She feels much better since making that switch.   Today she returns for follow up.  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  She did check her BP at home a few times at the end of July (see below), and it showed an increase in diastolic BP above goal.  Since we last saw she switched metoprolol to carvedilol, due to some extreme dreams.  She feels much better since making that switch.   Past Medical History: hyperlipidemia 6/20 - TC 239, TG 171, HDL 45, LDL 162 (on rosuvastatin 5 mg 12/20 - TC 175, TG 124, HDL 47, LDL 106  (on rosuvastatin 20 mg)  hypothyroidism 6/20 - 4.87 on synthroid 50 mcg     Blood Pressure Goal:  130/80  Previously tried:  Losartan, candesartan  Current Medications:  Lisinopril 40 mg, carvedilol 12.5 mg bid   Family Hx: dad had cerebral aneurysm, CABG, strokes, died 03/24/2004 at 57; mom liiving with hyperlipidemia; 1 sister with migraines  Social Hx: no tobacco, occasional alcohol (2-3 per month); 1 coffee in am, moslty water thruoughout day, some chocolate  Diet: lost 10  pounds in past few months; nut bars, protein bars for snacks, watches sodium, mostly home cooked; sandwiches (deli lunch meat, or PBJ); venison, limits pork except occasional bacon;   Exercise: very active at work, walks dog after work (75 lb)  Home BP readings: 6 readings, with average of 128/85.  Diastolic readings all > 80 with exception of one       Intolerances: amlodipine - edema  Labs: 05/2019     Na 140, K 4.5, Glu 105, BUN 23, SCr 0.7          11/2019    Na 136, K 3.8, Glu 94, Bun 17, SCr 0.59  Wt Readings from Last 3 Encounters:  08/22/20 203 lb 9.6 oz (92.4 kg)  04/17/20 193 lb (87.5 kg)  01/11/20 195 lb 3.2 oz (88.5 kg)   BP Readings from Last 3 Encounters:  08/22/20 132/84  04/17/20 120/82  01/11/20 122/72   Pulse Readings from Last 3 Encounters:  08/22/20 73  04/17/20 (!) 59  01/11/20 69    Current Outpatient Medications  Medication Sig Dispense Refill  . amitriptyline (ELAVIL) 25 MG tablet TAKE 1 TABLET BY MOUTH AT BEDTIME. 90 tablet 0  . carvedilol (COREG) 12.5 MG tablet Take 1 tablet (12.5 mg total) by mouth 2 (two) times daily. 60 tablet 3  . DUEXIS 800-26.6 MG TABS Take 1 tablet by  mouth 3 (three) times daily as needed. for pain  3  . ezetimibe (ZETIA) 10 MG tablet Take 1 tablet (10 mg total) by mouth daily. 90 tablet 3  . levothyroxine (SYNTHROID, LEVOTHROID) 50 MCG tablet Take one every morning before breakfast 90 tablet 2  . lisinopril (ZESTRIL) 40 MG tablet Take 1 tablet (40 mg total) by mouth daily. 30 tablet 10  . rosuvastatin (CRESTOR) 40 MG tablet Take 1 tablet (40 mg total) by mouth daily at 6 PM. (Patient taking differently: Take 20 mg by mouth daily at 6 PM. ) 90 tablet 3  . venlafaxine XR (EFFEXOR-XR) 75 MG 24 hr capsule Take by mouth.     No current facility-administered medications for this visit.    Allergies  Allergen Reactions  . Amlodipine Swelling  . Demerol [Meperidine] Nausea And Vomiting  . Metoprolol Succinate [Metoprolol]      Crazy dreams and night terrors  . Percocet [Oxycodone-Acetaminophen] Swelling    History reviewed. No pertinent past medical history.  Blood pressure 132/84, pulse 73, resp. rate 16, height 5\' 8"  (1.727 m), weight 203 lb 9.6 oz (92.4 kg), SpO2 96 %.  Hypertension Patient with essential hypertension, now mostly a diastolic issue.  Because she no longer is having issues with orthostatic hypotension, will have her re-challenge with chlorthalidone.  Will have her start with 12.5 mg every other day in the mornings.  Should she tolerate this, and her diastolic readings are still greater than 80, she should try to increase to 12.5 mg each day.  She is seeing Dr. Claiborne Billings in 3 months for follow up, we can see her after that should it be necessary.    Tommy Medal PharmD CPP [facility] [address] [place], [place] [address] [phone]

## 2020-08-22 NOTE — Patient Instructions (Signed)
  Check your blood pressure at home several times each week and keep record of the readings.  Take your BP meds as follows:  Re-start chlorthalidone.  Take 1/2 tablet every other day.  If the problems with dizziness get worse, just discontinue.    Bring all of your meds, your BP cuff and your record of home blood pressures to your next appointment.  Exercise as you're able, try to walk approximately 30 minutes per day.  Keep salt intake to a minimum, especially watch canned and prepared boxed foods.  Eat more fresh fruits and vegetables and fewer canned items.  Avoid eating in fast food restaurants.    HOW TO TAKE YOUR BLOOD PRESSURE: . Rest 5 minutes before taking your blood pressure. .  Don't smoke or drink caffeinated beverages for at least 30 minutes before. . Take your blood pressure before (not after) you eat. . Sit comfortably with your back supported and both feet on the floor (don't cross your legs). . Elevate your arm to heart level on a table or a desk. . Use the proper sized cuff. It should fit smoothly and snugly around your bare upper arm. There should be enough room to slip a fingertip under the cuff. The bottom edge of the cuff should be 1 inch above the crease of the elbow. . Ideally, take 3 measurements at one sitting and record the average.

## 2020-08-22 NOTE — Assessment & Plan Note (Signed)
Patient with essential hypertension, now mostly a diastolic issue.  Because she no longer is having issues with orthostatic hypotension, will have her re-challenge with chlorthalidone.  Will have her start with 12.5 mg every other day in the mornings.  Should she tolerate this, and her diastolic readings are still greater than 80, she should try to increase to 12.5 mg each day.  She is seeing Dr. Claiborne Billings in 3 months for follow up, we can see her after that should it be necessary.

## 2020-10-01 MED FILL — CARVEDILOL 12.5 MG TABLET: 12.5 | 30 days supply | Qty: 60 | Fill #3

## 2020-10-02 ENCOUNTER — Other Ambulatory Visit (HOSPITAL_COMMUNITY): Payer: Self-pay | Admitting: Physician Assistant

## 2020-10-02 MED FILL — VIT D2 1.25 MG (50,000 UNIT: 1.25 MG | 84 days supply | Qty: 12 | Fill #0

## 2020-10-02 MED FILL — LEVOTHYROXINE 50 MCG TABLET: 50 | 90 days supply | Qty: 90 | Fill #0

## 2020-10-02 MED FILL — AMITRIPTYLINE HCL 25 MG TAB: 25 | 90 days supply | Qty: 90 | Fill #0

## 2020-10-08 ENCOUNTER — Other Ambulatory Visit (HOSPITAL_COMMUNITY): Payer: Self-pay | Admitting: Sports Medicine

## 2020-10-08 MED FILL — ONDANSETRON HCL 4 MG TABLET: 4 | 3 days supply | Qty: 9 | Fill #0

## 2020-10-16 MED FILL — EZETIMIBE 10 MG TABS: 10 | 90 days supply | Qty: 90 | Fill #2

## 2020-10-17 MED FILL — LISINOPRIL 40 MG TABS: 40 | 30 days supply | Qty: 30 | Fill #1

## 2020-10-24 ENCOUNTER — Other Ambulatory Visit (HOSPITAL_COMMUNITY): Payer: Self-pay | Admitting: Sports Medicine

## 2020-10-24 MED FILL — metroNIDAZOLE 500 MG TABS: 500 | 10 days supply | Qty: 20 | Fill #0

## 2020-10-30 ENCOUNTER — Other Ambulatory Visit (HOSPITAL_COMMUNITY): Payer: Self-pay | Admitting: Physician Assistant

## 2020-10-30 ENCOUNTER — Other Ambulatory Visit: Payer: Self-pay | Admitting: Cardiovascular Disease

## 2020-10-30 MED FILL — CARVEDILOL 12.5 MG TABLET: 12.5 | 45 days supply | Qty: 90 | Fill #0

## 2020-10-30 MED FILL — VENLAFAXINE HCL ER 75 MG CA: 75 | 90 days supply | Qty: 90 | Fill #0

## 2020-10-30 NOTE — Telephone Encounter (Signed)
Rx has been sent to the pharmacy electronically. ° °

## 2020-11-14 ENCOUNTER — Telehealth: Payer: Self-pay | Admitting: Internal Medicine

## 2020-11-14 NOTE — Telephone Encounter (Signed)
Pt called in states she had a colonoscopy done 5 years ago at [facility], she will have the report faxed over to [place] for Dr Hilarie Fredrickson to review.

## 2020-11-15 ENCOUNTER — Other Ambulatory Visit: Payer: Self-pay

## 2020-11-15 ENCOUNTER — Encounter: Payer: Self-pay | Admitting: Cardiovascular Disease

## 2020-11-15 ENCOUNTER — Ambulatory Visit: Payer: PRIVATE HEALTH INSURANCE | Admitting: Cardiovascular Disease

## 2020-11-15 DIAGNOSIS — I10 Essential (primary) hypertension: Secondary | ICD-10-CM | POA: Diagnosis not present

## 2020-11-15 DIAGNOSIS — E7800 Pure hypercholesterolemia, unspecified: Secondary | ICD-10-CM | POA: Diagnosis not present

## 2020-11-15 DIAGNOSIS — E039 Hypothyroidism, unspecified: Secondary | ICD-10-CM | POA: Diagnosis not present

## 2020-11-15 DIAGNOSIS — Z8249 Family history of ischemic heart disease and other diseases of the circulatory system: Secondary | ICD-10-CM

## 2020-11-15 MED ORDER — CHLORTHALIDONE 25 MG PO TABS: ORAL_TABLET | ORAL | 2 refills | Status: DC

## 2020-11-15 NOTE — Patient Instructions (Signed)
Medication Instructions:  No changes *If you need a refill on your cardiac medications before your next appointment, please call your pharmacy*   Lab Work: None ordered If you have labs (blood work) drawn today and your tests are completely normal, you will receive your results only by: Marland Kitchen MyChart Message (if you have MyChart) OR . A paper copy in the mail If you have any lab test that is abnormal or we need to change your treatment, we will call you to review the results.   Testing/Procedures: None ordered   Follow-Up: At [facility] you and your health needs are our priority.  As part of our continuing mission to provide you with exceptional heart care, we have created designated Provider Care Teams.  These Care Teams include your primary Cardiologist (physician) and Advanced Practice Providers (APPs -  Physician Assistants and Nurse Practitioners) who all work together to provide you with the care you need, when you need it.  We recommend signing up for the patient portal called "MyChart".  Sign up information is provided on this After Visit Summary.  MyChart is used to connect with patients for Virtual Visits (Telemedicine).  Patients are able to view lab/test results, encounter notes, upcoming appointments, etc.  Non-urgent messages can be sent to your provider as well.   To learn more about what you can do with MyChart, go to [email or web address].    Your next appointment:   12 month(s)  The format for your next appointment:   In Person  Provider:   Shelva Majestic, MD   Other Instructions None

## 2020-11-15 NOTE — Progress Notes (Signed)
Cardiology Office Note    Date:  11/17/2020   ID:  KELCY LAIBLE, DOB 07-05-1966, MRN [id number]  PCP:  Aura Dials, PA-C  Cardiologist:  Shelva Majestic, MD   F/U cardiology evaluation initiallly referred through the courtesy of Mattie Marlin, PA-C and Dr. Melford Aase.  History of Present Illness:  LABERTA WILBON is a 54 y.o. female has a hypertension for at least 55 years and strong family history for heart disease an.  She was referred by Mattie Marlin, PA for cardiology evaluation following recent hypertension exacerbation.  I saw her for initial evaluation on October 11, 2019 and  last saw her in May 2021.She presents for a 6 month follow-up evaluation.  Ms. Shifflett admits to at least a 22-year history of hypertension.  In the past she had been on numerous medications including amlodipine, ARB therapy, recently was on lisinopril 20 mg daily and recently was started on metoprolol succinate initially at 25 mg. Due to continued blood pressure elevation, her Toprol-XL has been titrated to 50 mg and further titrated to 100 mg  by Mattie Marlin, PA. She has a history of significant hyperlipidemia with laboratory in June 2020 showing a total cholesterol of 239, LDL 162, triglycerides 171 and HDL 45.  She has been recently started on Crestor 5 mg.  She also has a history of hypothyroidism on levothyroxine 50 mcg and is on Effexor XR in addition to amitriptyline 25 mg at bedtime.  She has a significant family history for heart disease with her father dying at age 7 but having heart disease starting in his 38s will he underwent CABG revascularization in his 9s.  Her mother has a history of hyperlipidemia and due to chronic smoking has developed pulmonary fibrosis.  Maternal grandfather had heart disease and died at age 88.  She has a sister who is 53 years old.  When I initially saw her in October 2020, her blood pressures were improved but still mildly increased based on new hypertensive guidelines.   As result I recommended further titration of lisinopril to 40 mg daily.  I scheduled her for an echo Doppler study.  I also discussed with her data regarding subclinical atherosclerosis with elevated LDL levels in June 2020 at 162.  At time she was only on Crestor 5 mg and I recommended titration to at least 20 mg.    She underwent an echo Doppler study in October 25, 2019 which showed an EF of 60 to 65% with grade 1 diastolic dysfunction.  There was mild dilation of her ascending aorta at 40 mm.  There was mild aortic sclerosis without stenosis.  She had normal pulmonary pressures.  She was subsequently seen by Joslyn Hy, Pharm.D. and hypertension clinic and chlorthalidone 25 mg was added to her medical regimen.  On simvastatin 20 mg her LDL dropped from 162 down to 106.  I saw her on January 11, 2020 at which time she remained stable and denied chest pain or shortness of breath.  She began to notice episodes of lightheadedness and low blood pressure.  During that evaluation I recommended that she reduce her chlorthalidone from 25 mg down to 12.5 mg daily.  She continues to be on levothyroxine for hypothyroidism.  She also had recently been started on Effexor XR and I discussed with her that this can also have some potential orthostatic hypotensive effects.  She underwent follow-up laboratory on April 13, 2020.  Total cholesterol was 204 HDL 50 LDL 132.  I  last saw her on Apr 27, 2020. She was taking rosuvastatin at 20 mg instead of 40 mg and has tolerated this better due to some aches at the 40 mg dose.  She denied chest pain.  She denied palpitations.  During that evaluation I recommended the addition of Zetia to her medical regimen.  Her blood pressure was low and I recommended she discontinue chlorthalidone but continue lisinopril 40 mg in addition to metoprolol succinate 100 mgs.  Over the past several months, she has been feeling well.  She continues to be very busy at work.  She also was  caring for her boyfriend who had discitis of L3-L4 and required significant antibiotic therapy.  She states her blood pressure at home typically runs around 116/75.  She denies any chest pain or palpitations.  Most recent lipid studies in August 2021 now showed an LDL cholesterol at 70.  Triglycerides were 152 with total cholesterol 140.  Renal function was stable.  She apparently was taken off metoprolol succinate due to significant dreams with possible night terrors and is now on carvedilol for beta-blocker therapy.  She presents for evaluation.   Past medical/surgical history is also notable for bilateral TMJ surgery, bilateral ankle surgery, and nasal antrostomy x2.  Current Medications: Outpatient Medications Prior to Visit  Medication Sig Dispense Refill  . amitriptyline (ELAVIL) 25 MG tablet TAKE 1 TABLET BY MOUTH AT BEDTIME. 90 tablet 0  . carvedilol (COREG) 12.5 MG tablet Take 1 tablet (12.5 mg total) by mouth 2 (two) times daily. 90 tablet 1  . DUEXIS 800-26.6 MG TABS Take 1 tablet by mouth 3 (three) times daily as needed. for pain  3  . ergocalciferol (VITAMIN D2) 1.25 MG (50000 UT) capsule Vitamin D2 1,250 mcg (50,000 unit) capsule  TAKE ONE CAPSULE (50,000 UNITS DOSE) BY MOUTH ONCE A WEEK. ONCE A WEEK FOR 12 WEEKS    . ezetimibe (ZETIA) 10 MG tablet Take 1 tablet (10 mg total) by mouth daily. 90 tablet 3  . levothyroxine (SYNTHROID, LEVOTHROID) 50 MCG tablet Take one every morning before breakfast 90 tablet 2  . lisinopril (ZESTRIL) 40 MG tablet Take 1 tablet (40 mg total) by mouth daily. 30 tablet 10  . ondansetron (ZOFRAN) 4 MG tablet Take 4 mg by mouth every 6 (six) hours as needed.    . rosuvastatin (CRESTOR) 40 MG tablet Take 1 tablet (40 mg total) by mouth daily at 6 PM. (Patient taking differently: Take 20 mg by mouth daily at 6 PM. ) 90 tablet 3  . tiZANidine (ZANAFLEX) 4 MG tablet tizanidine 4 mg tablet  TAKE 1 TABLET BY MOUTH THREE TIMES A DAY AS NEEDED FOR SPASM    .  venlafaxine XR (EFFEXOR-XR) 75 MG 24 hr capsule Take by mouth.    . chlorthalidone (HYGROTON) 25 MG tablet Take 25 mg by mouth. Pt takes 1/2 tablet every other day     No facility-administered medications prior to visit.     Allergies:   Amlodipine, Demerol [meperidine], Metoprolol succinate [metoprolol], and Percocet [oxycodone-acetaminophen]   Social History   Socioeconomic History  . Marital status: Divorced    Spouse name: Not on file  . Number of children: Not on file  . Years of education: Not on file  . Highest education level: Not on file  Occupational History  . Not on file  Tobacco Use  . Smoking status: Never Smoker  . Smokeless tobacco: Never Used  Substance and Sexual Activity  . Alcohol use: Not  on file  . Drug use: Not on file  . Sexual activity: Not on file  Other Topics Concern  . Not on file  Social History Narrative  . Not on file   Social Determinants of Health   Financial Resource Strain:   . Difficulty of Paying Living Expenses: Not on file  Food Insecurity:   . Worried About Charity fundraiser in the Last Year: Not on file  . Ran Out of Food in the Last Year: Not on file  Transportation Needs:   . Lack of Transportation (Medical): Not on file  . Lack of Transportation (Non-Medical): Not on file  Physical Activity:   . Days of Exercise per Week: Not on file  . Minutes of Exercise per Session: Not on file  Stress:   . Feeling of Stress : Not on file  Social Connections:   . Frequency of Communication with Friends and Family: Not on file  . Frequency of Social Gatherings with Friends and Family: Not on file  . Attends Religious Services: Not on file  . Active Member of Clubs or Organizations: Not on file  . Attends Archivist Meetings: Not on file  . Marital Status: Not on file    Socially she is divorced x2 with her last divorce 5 years ago.  She lives by herself.  She is an Pensions consultant at [employer].   Family  History:  The patient's family history includes Breast cancer in her maternal grandmother.  Her mother is living at age 53 and has hyperlipidemia, rheumatoid arthritis, and pulmonary fibrosis; father died at age 24 but had history of CABG revascularization, stroke and AV malformation.  A sister is living at age 82  ROS General: Negative; No fevers, chills, or night sweats;  HEENT: Negative; No changes in vision or hearing, sinus congestion, difficulty swallowing Pulmonary: Negative; No cough, wheezing, shortness of breath, hemoptysis Cardiovascular: Negative; No chest pain, presyncope, syncope, palpitations GI: Negative; No nausea, vomiting, diarrhea, or abdominal pain GU: Negative; No dysuria, hematuria, or difficulty voiding Musculoskeletal: Negative; no myalgias, joint pain, or weakness Hematologic/Oncology: Negative; no easy bruising, bleeding Endocrine: Positive for hypothyroidism Neuro: Negative; no changes in balance, headaches Skin: Negative; No rashes or skin lesions Psychiatric: Mild anxiety/depression Sleep: Negative; No snoring, daytime sleepiness, hypersomnolence, bruxism, restless legs, hypnogognic hallucinations, no cataplexy Other comprehensive 14 point system review is negative.   PHYSICAL EXAM:   VS:  BP (!) 112/96 (BP Location: Right Arm, Patient Position: Sitting)   Pulse 63   Ht _0  (1.727 m)   Wt 202 lb 6.4 oz (91.8 kg)   SpO2 96%   BMI 30.77 kg/m     Repeat blood pressure by me was 112/78 supine and 112/76 standing  Wt Readings from Last 3 Encounters:  11/15/20 202 lb 6.4 oz (91.8 kg)  08/22/20 203 lb 9.6 oz (92.4 kg)  04/17/20 193 lb (87.5 kg)     BP (!) 112/96 (BP Location: Right Arm, Patient Position: Sitting)   Pulse 63   Ht _1  (1.727 m)   Wt 202 lb 6.4 oz (91.8 kg)   SpO2 96%   BMI 30.77 kg/m  General: Alert, oriented, no distress.  Skin: normal turgor, no rashes, warm and dry HEENT: Normocephalic, atraumatic. Pupils equal round and  reactive to light; sclera anicteric; extraocular muscles intact;  Nose without nasal septal hypertrophy Mouth/Parynx benign; Mallinpatti scale 3 Neck: No JVD, no carotid bruits; normal carotid upstroke Lungs: clear to ausculatation  and percussion; no wheezing or rales Chest wall: without tenderness to palpitation Heart: PMI not displaced, RRR, s1 s2 normal, 1/6 systolic murmur, no diastolic murmur, no rubs, gallops, thrills, or heaves Abdomen: soft, nontender; no hepatosplenomehaly, BS+; abdominal aorta nontender and not dilated by palpation. Back: no CVA tenderness Pulses 2+ Musculoskeletal: full range of motion, normal strength, no joint deformities Extremities: no clubbing cyanosis or edema, Homan's sign negative  Neurologic: grossly nonfocal; Cranial nerves grossly wnl Psychologic: Normal mood and affect   Studies/Labs Reviewed:   EKG:  EKG is ordered today.  ECG (independently read by me): NSR at 55, nonspecific T wave abnormality   May 4, 2021ECG (independently read by me): Sinus bradycardia 59 bpm.  No ectopy.  Normal intervals.  January 11, 2020 ECG (independently read by me): Normal sinus rhythm at 69 bpm.  No ectopy.  Normal intervals  October 11, 2019 ECG (independently read by me): Normal sinus rhythm at 64 bpm.  No ectopy.  Normal intervals.  Recent Labs: BMP Latest Ref Rng & Units 11/22/2019  Glucose 65 - 99 mg/dL 94  BUN 6 - 24 mg/dL 17  Creatinine 0.57 - 1.00 mg/dL 0.59  BUN/Creat Ratio 9 - 23 29(H)  Sodium 134 - 144 mmol/L 136  Potassium 3.5 - 5.2 mmol/L 3.8  Chloride 96 - 106 mmol/L 96  CO2 20 - 29 mmol/L 24  Calcium 8.7 - 10.2 mg/dL 9.5     Hepatic Function Latest Ref Rng & Units 11/22/2019  Total Protein 6.0 - 8.5 g/dL 6.7  Albumin 3.8 - 4.9 g/dL 4.5  AST 0 - 40 IU/L 17  ALT 0 - 32 IU/L 20  Alk Phosphatase 39 - 117 IU/L 99  Total Bilirubin 0.0 - 1.2 mg/dL 0.3    No flowsheet data found. No results found for: MCV No results found for: TSH No  results found for: HGBA1C   BNP No results found for: BNP  ProBNP No results found for: PROBNP   Lipid Panel     Component Value Date/Time   CHOL 175 11/22/2019 0936   TRIG 124 11/22/2019 0936   HDL 47 11/22/2019 0936   CHOLHDL 3.7 11/22/2019 0936   LDLCALC 106 (H) 11/22/2019 0936   LABVLDL 22 11/22/2019 0936     RADIOLOGY: No results found.   Additional studies/ records that were reviewed today include:  I reviewed the records of Mattie Marlin, [place] from [facility].    ASSESSMENT:    1. Essential hypertension   2. Family history of heart disease   3. Hypothyroidism, unspecified type   4. 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  She has a history of hypertension of at least 223years and has been on several types of therapy but apparently had a reaction to amlodipine and was taken off ARB therapy.  When I initially saw her, she was on lisinopril 20 mg and Toprol-XL had been initiated and titrated up to 100 mg by Mattie Marlin.  During that initial evaluation I recommended further titration of lisinopril to 40 mg with continued blood pressure elevation.  Her echo Doppler study demonstrates normal systolic function with EF at 60 to 65% and grade 1 diastolic dysfunction.  There was evidence for mild aortic valve sclerosis and mild dilatation of ascending aorta measuring 40 mm.  I had a  long discussion with her regarding subclinical atherosclerosis and with LDL cholesterol in June 2020 at 162 for which she was started on Crestor 5 mg.  She had undergone subsequent additional titrations and apparently now is on a regimen consisting of rosuvastatin 40 mg in addition to Zetia 10 mg.  Her most recent LDL cholesterol is now excellent at 70.  Her blood pressure now is stable.  When I last saw her I discontinued  daily chlorthalidone and she states she now takes just 12.5 mg every other day.  She continues to be on lisinopril 40 mg daily and carvedilol 12.5 mg twice a day.  Her ECG shows sinus bradycardia 55 bpm.  She is asymptomatic and denies palpitations.  She was under some increased stress taking care of her boyfriend who had developed significant L3-L4 discitis.  She also works long hours at work.  She has hypothyroidism on levothyroxine 50 mcg.  Since I last saw her, her weight is increased from 193 to 202.  She has recently started the [employer] program to aid with diet and weight loss.  She has a follow-up appointment to see her primary provider Mattie Marlin, NP in 3 months.  As long as she remains stable, I will see her in 1 year for follow-up evaluation or sooner as needed..    Medication Adjustments/Labs and Tests Ordered: Current medicines are reviewed at length with the patient today.  Concerns regarding medicines are outlined above.  Medication changes, Labs and Tests ordered today are listed in the Patient Instructions below.  Patient Instructions  Medication Instructions:  No changes *If you need a refill on your cardiac medications before your next appointment, please call your pharmacy*   Lab Work: None ordered If you have labs (blood work) drawn today and your tests are completely normal, you will receive your results only by: Marland Kitchen MyChart Message (if you have MyChart) OR . A paper copy in the mail If you have any lab test that is abnormal or we need to change your treatment, we will call you to review the results.   Testing/Procedures: None ordered   Follow-Up: At [facility], you and your health needs are our priority.  As part of our continuing mission to provide you with exceptional heart care, we have created designated Provider Care Teams.  These Care Teams include your primary Cardiologist (physician) and Advanced Practice Providers (APPs -  Physician Assistants and Nurse  Practitioners) who all work together to provide you with the care you need, when you need it.  We recommend signing up for the patient portal called "MyChart".  Sign up information is provided on this After Visit Summary.  MyChart is used to connect with patients for Virtual Visits (Telemedicine).  Patients are able to view lab/test results, encounter notes, upcoming appointments, etc.  Non-urgent messages can be sent to your provider as well.   To learn more about what you can do with MyChart, go to [email or web address].    Your next appointment:   12 month(s)  The format for your next appointment:   In Person  Provider:   Shelva Majestic, MD   Other Instructions None     Signed, Shelva Majestic, MD  11/17/2020 10:55 AM    [facility] [address] [place], [place], [place]  [address] Phone: [phone]

## 2020-11-16 NOTE — Telephone Encounter (Signed)
Patient had a colonoscopy with Dr. Ferdinand Lango in 2016, one polyp was removed but I do not see the specific pathology report Prep listed as an adequate  Based on these findings would recommend repeat colonoscopy at this time

## 2020-11-16 NOTE — Telephone Encounter (Signed)
Hi Dr. Hilarie Fredrickson, we received GI records for this patient who would like to transfer her care to you.  She stated that she is due for a colonoscopy.  Her previous one was in 08/2015.  Records will be sent to you for review.  Please advise on scheduling.  Thank you.

## 2020-11-17 ENCOUNTER — Encounter: Payer: Self-pay | Admitting: Cardiovascular Disease

## 2020-11-21 NOTE — Telephone Encounter (Signed)
Left message to call back  

## 2020-12-10 MED FILL — ROSUVASTATIN CALCIUM 40 MG: 40 | 90 days supply | Qty: 90 | Fill #2

## 2020-12-10 MED FILL — LISINOPRIL 40 MG TABS: 40 | 30 days supply | Qty: 30 | Fill #2

## 2020-12-10 MED FILL — LEVOTHYROXINE 50 MCG TABLET: 50 | 90 days supply | Qty: 90 | Fill #1

## 2020-12-10 MED FILL — CARVEDILOL 12.5 MG TABLET: 12.5 | 45 days supply | Qty: 90 | Fill #1

## 2020-12-28 MED FILL — EZETIMIBE 10 MG TABS: 10 | 90 days supply | Qty: 90 | Fill #3

## 2020-12-28 MED FILL — AMITRIPTYLINE HCL 25 MG TAB: 25 | 90 days supply | Qty: 90 | Fill #1

## 2021-01-09 ENCOUNTER — Other Ambulatory Visit (HOSPITAL_COMMUNITY): Payer: Self-pay | Admitting: Physical Medicine & Rehabilitation

## 2021-01-09 MED FILL — MELOXICAM 15 MG TABLET: 15 | 30 days supply | Qty: 30 | Fill #0

## 2021-01-17 ENCOUNTER — Other Ambulatory Visit: Payer: Self-pay | Admitting: Internal Medicine

## 2021-01-17 ENCOUNTER — Ambulatory Visit (AMBULATORY_SURGERY_CENTER): Payer: Self-pay | Admitting: *Deleted

## 2021-01-17 ENCOUNTER — Other Ambulatory Visit: Payer: Self-pay

## 2021-01-17 VITALS — Ht 68.0 in | Wt 209.0 lb

## 2021-01-17 DIAGNOSIS — Z86010 Personal history of colonic polyps: Secondary | ICD-10-CM

## 2021-01-17 MED ORDER — PLENVU 140 G PO SOLR: 1.0000 | Freq: Once | ORAL | 0 refills | Status: DC

## 2021-01-17 MED FILL — PLENVU 140 GM SOLR: 140 | 1 days supply | Qty: 3 | Fill #0

## 2021-01-17 NOTE — Progress Notes (Signed)
Patient is here in-person for PV. Patient denies any allergies to eggs or soy. Patient denies any problems with anesthesia/sedation. Patient denies any oxygen use at home. Patient denies taking any diet/weight loss medications or blood thinners. Patient is not being treated for MRSA or C-diff. Patient is aware of our care-partner policy and RCVKF-84 safety protocol. EMMI education assigned to the patient for the procedure, sent to [place].   COVID-19 vaccines completed on 10/2020 x3, per patient.   Prep Prescription coupon given to the patient.

## 2021-01-29 ENCOUNTER — Encounter: Payer: Self-pay | Admitting: Internal Medicine

## 2021-01-30 ENCOUNTER — Other Ambulatory Visit (HOSPITAL_COMMUNITY): Payer: Self-pay | Admitting: Physician Assistant

## 2021-01-30 ENCOUNTER — Other Ambulatory Visit: Payer: Self-pay | Admitting: Cardiovascular Disease

## 2021-01-30 MED FILL — CARVEDILOL 12.5 MG TABLET: 12.5 | 45 days supply | Qty: 90 | Fill #0

## 2021-01-30 MED FILL — VENLAFAXINE HCL ER 75 MG CA: 75 | 90 days supply | Qty: 90 | Fill #0

## 2021-01-31 ENCOUNTER — Other Ambulatory Visit: Payer: Self-pay

## 2021-01-31 ENCOUNTER — Ambulatory Visit (AMBULATORY_SURGERY_CENTER): Payer: PRIVATE HEALTH INSURANCE | Admitting: Internal Medicine

## 2021-01-31 ENCOUNTER — Encounter: Payer: Self-pay | Admitting: Internal Medicine

## 2021-01-31 VITALS — BP 119/71 | HR 68 | Temp 98.0°F | Resp 13 | Ht 68.0 in | Wt 209.0 lb

## 2021-01-31 DIAGNOSIS — Z86010 Personal history of colonic polyps: Secondary | ICD-10-CM

## 2021-01-31 DIAGNOSIS — D123 Benign neoplasm of transverse colon: Secondary | ICD-10-CM

## 2021-01-31 DIAGNOSIS — D120 Benign neoplasm of cecum: Secondary | ICD-10-CM

## 2021-01-31 DIAGNOSIS — K635 Polyp of colon: Secondary | ICD-10-CM

## 2021-01-31 MED ORDER — SODIUM CHLORIDE 0.9 % IV SOLN: 500.0000 mL | Freq: Once | INTRAVENOUS | Status: DC

## 2021-01-31 NOTE — Progress Notes (Signed)
Pt's states no medical or surgical changes since previsit or office visit. 

## 2021-01-31 NOTE — Progress Notes (Signed)
Called to room to assist during endoscopic procedure.  Patient ID and intended procedure confirmed with present staff. Received instructions for my participation in the procedure from the performing physician.  

## 2021-01-31 NOTE — Op Note (Signed)
[facility] Patient Name: Kimberly Koch Procedure Date: 01/31/2021 8:47 AM MRN: [id number] Endoscopist: Jerene Bears , MD Age: 55 Referring MD:  Date of Birth: Mar 20, 1966 Gender: Female Account #: [id number] Procedure:                Colonoscopy Indications:              Personal history of colonic polyps, Last                            colonoscopy: 2016 with Dr. Ferdinand Lango Medicines:                Monitored Anesthesia Care Procedure:                Pre-Anesthesia Assessment:                           - Prior to the procedure, a History and Physical                            was performed, and patient medications and                            allergies were reviewed. The patient's tolerance of                            previous anesthesia was also reviewed. The risks                            and benefits of the procedure and the sedation                            options and risks were discussed with the patient.                            All questions were answered, and informed consent                            was obtained. Prior Anticoagulants: The patient has                            taken no previous anticoagulant or antiplatelet                            agents. ASA Grade Assessment: II - A patient with                            mild systemic disease. After reviewing the risks                            and benefits, the patient was deemed in                            satisfactory condition to undergo the procedure.  After obtaining informed consent, the colonoscope                            was passed under direct vision. Throughout the                            procedure, the patient's blood pressure, pulse, and                            oxygen saturations were monitored continuously. The                            Olympus PFC-[id number] (#[id number]) Colonoscope was                            introduced through the anus and advanced  to the                            cecum, identified by appendiceal orifice and                            ileocecal valve. The colonoscopy was performed                            without difficulty. The patient tolerated the                            procedure well. The quality of the bowel                            preparation was good. The ileocecal valve,                            appendiceal orifice, and rectum were photographed. Scope In: 9:06:22 AM Scope Out: 9:24:48 AM Scope Withdrawal Time: 0 hours 13 minutes 38 seconds  Total Procedure Duration: 0 hours 18 minutes 26 seconds  Findings:                 The digital rectal exam was normal.                           A 6 mm polyp was found in the cecum. The polyp was                            sessile. The polyp was removed with a cold snare.                            Resection and retrieval were complete.                           A 1 mm polyp was found in the cecum. The polyp was                            sessile. The polyp was  removed with a cold biopsy                            forceps. Resection and retrieval were complete.                           A 3 mm polyp was found in the transverse colon. The                            polyp was sessile. The polyp was removed with a                            cold snare. Resection and retrieval were complete.                           Multiple small and large-mouthed diverticula were                            found from ascending colon to sigmoid colon.                           Internal hemorrhoids were found during                            retroflexion. The hemorrhoids were small. Complications:            No immediate complications. Estimated Blood Loss:     Estimated blood loss was minimal. Impression:               - One 6 mm polyp in the cecum, removed with a cold                            snare. Resected and retrieved.                           - One 1 mm polyp in the  cecum, removed with a cold                            biopsy forceps. Resected and retrieved.                           - One 3 mm polyp in the transverse colon, removed                            with a cold snare. Resected and retrieved.                           - Diverticulosis from ascending colon to sigmoid                            colon.                           - Small internal hemorrhoids. Recommendation:           -  Patient has a contact number available for                            emergencies. The signs and symptoms of potential                            delayed complications were discussed with the                            patient. Return to normal activities tomorrow.                            Written discharge instructions were provided to the                            patient.                           - Resume previous diet.                           - Continue present medications.                           - Await pathology results.                           - Repeat colonoscopy is recommended for                            surveillance. The colonoscopy date will be                            determined after pathology results from today's                            exam become available for review. Jerene Bears, MD 01/31/2021 9:28:25 AM This report has been signed electronically.

## 2021-01-31 NOTE — Patient Instructions (Signed)
Information on polyps, diverticulosis and hemorrhoids given to you today.  Await pathology results.  Resume previous diet and medications.  YOU HAD AN ENDOSCOPIC PROCEDURE TODAY AT THE [facility]:   Refer to the procedure report that was given to you for any specific questions about what was found during the examination.  If the procedure report does not answer your questions, please call your gastroenterologist to clarify.  If you requested that your care partner not be given the details of your procedure findings, then the procedure report has been included in a sealed envelope for you to review at your convenience later.  YOU SHOULD EXPECT: Some feelings of bloating in the abdomen. Passage of more gas than usual.  Walking can help get rid of the air that was put into your GI tract during the procedure and reduce the bloating. If you had a lower endoscopy (such as a colonoscopy or flexible sigmoidoscopy) you may notice spotting of blood in your stool or on the toilet paper. If you underwent a bowel prep for your procedure, you may not have a normal bowel movement for a few days.  Please Note:  You might notice some irritation and congestion in your nose or some drainage.  This is from the oxygen used during your procedure.  There is no need for concern and it should clear up in a day or so.  SYMPTOMS TO REPORT IMMEDIATELY:   Following lower endoscopy (colonoscopy or flexible sigmoidoscopy):  Excessive amounts of blood in the stool  Significant tenderness or worsening of abdominal pains  Swelling of the abdomen that is new, acute  Fever of 100F or higher   For urgent or emergent issues, a gastroenterologist can be reached at any hour by calling [phone]. Do not use MyChart messaging for urgent concerns.    DIET:  We do recommend a small meal at first, but then you may proceed to your regular diet.  Drink plenty of fluids but you should avoid alcoholic beverages for 24  hours.  ACTIVITY:  You should plan to take it easy for the rest of today and you should NOT DRIVE or use heavy machinery until tomorrow (because of the sedation medicines used during the test).    FOLLOW UP: Our staff will call the number listed on your records 48-72 hours following your procedure to check on you and address any questions or concerns that you may have regarding the information given to you following your procedure. If we do not reach you, we will leave a message.  We will attempt to reach you two times.  During this call, we will ask if you have developed any symptoms of COVID 19. If you develop any symptoms (ie: fever, flu-like symptoms, shortness of breath, cough etc.) before then, please call [phone].  If you test positive for Covid 19 in the 2 weeks post procedure, please call and report this information to us.    If any biopsies were taken you will be contacted by phone or by letter within the next 1-3 weeks.  Please call us at [phone] if you have not heard about the biopsies in 3 weeks.    SIGNATURES/CONFIDENTIALITY: You and/or your care partner have signed paperwork which will be entered into your electronic medical record.  These signatures attest to the fact that that the information above on your After Visit Summary has been reviewed and is understood.  Full responsibility of the confidentiality of this discharge information lies with you   and/or your care-partner. 

## 2021-01-31 NOTE — Progress Notes (Signed)
PT taken to PACU. Monitors in place. VSS. Report given to RN. 

## 2021-02-04 ENCOUNTER — Telehealth: Payer: Self-pay | Admitting: *Deleted

## 2021-02-04 NOTE — Telephone Encounter (Signed)
  Follow up Call-  Call back number 01/31/2021  Post procedure Call Back phone  # [phone]  Permission to leave phone message Yes  Some recent data might be hidden     Patient questions:  Do you have a fever, pain , or abdominal swelling? No. Pain Score  0 *  Have you tolerated food without any problems? Yes.    Have you been able to return to your normal activities? Yes.    Do you have any questions about your discharge instructions: Diet   No. Medications  No. Follow up visit  No.  Do you have questions or concerns about your Care? No.  Actions: * If pain score is 4 or above: No action needed, pain <4.  1. Have you developed a fever since your procedure? no  2.   Have you had an respiratory symptoms (SOB or cough) since your procedure? no  3.   Have you tested positive for COVID 19 since your procedure no  4.   Have you had any family members/close contacts diagnosed with the COVID 19 since your procedure?  no   If yes to any of these questions please route to Joylene John, RN and Joella Prince, RN

## 2021-02-04 NOTE — Telephone Encounter (Signed)
Follow up call, without answer. VM left. Will call this afternoon for attempt #2.

## 2021-02-08 ENCOUNTER — Encounter: Payer: Self-pay | Admitting: Internal Medicine

## 2021-03-22 ENCOUNTER — Other Ambulatory Visit: Payer: Self-pay | Admitting: Cardiovascular Disease

## 2021-03-22 ENCOUNTER — Other Ambulatory Visit (HOSPITAL_COMMUNITY): Payer: Self-pay

## 2021-03-22 MED FILL — Carvedilol Tab 12.5 MG: ORAL | 30 days supply | Qty: 60 | Fill #0 | Status: AC

## 2021-03-22 MED FILL — Lisinopril Tab 40 MG: ORAL | 30 days supply | Qty: 30 | Fill #0 | Status: AC

## 2021-03-25 ENCOUNTER — Other Ambulatory Visit (HOSPITAL_COMMUNITY): Payer: Self-pay

## 2021-03-25 MED ORDER — AMITRIPTYLINE HCL 25 MG PO TABS
25.0000 mg | ORAL_TABLET | Freq: Every day | ORAL | 1 refills | Status: DC
  Filled 2021-03-25: qty 90, 90d supply, fill #0
  Filled 2021-07-07: qty 90, 90d supply, fill #1

## 2021-03-25 MED ORDER — CHLORTHALIDONE 25 MG PO TABS
25.0000 mg | ORAL_TABLET | Freq: Once | ORAL | 2 refills | Status: DC
  Filled 2021-03-25: qty 7, 28d supply, fill #0

## 2021-03-25 MED ORDER — LEVOTHYROXINE SODIUM 50 MCG PO TABS
50.0000 ug | ORAL_TABLET | Freq: Every day | ORAL | 1 refills | Status: DC
Start: 2021-03-25 — End: 2021-10-08
  Filled 2021-03-25: qty 90, 90d supply, fill #0
  Filled 2021-07-07: qty 90, 90d supply, fill #1

## 2021-03-25 MED ORDER — EZETIMIBE 10 MG PO TABS
10.0000 mg | ORAL_TABLET | Freq: Every day | ORAL | 2 refills | Status: DC
  Filled 2021-03-25: qty 90, 90d supply, fill #0
  Filled 2021-07-07: qty 90, 90d supply, fill #1
  Filled 2021-10-07: qty 90, 90d supply, fill #2

## 2021-03-29 ENCOUNTER — Other Ambulatory Visit (HOSPITAL_COMMUNITY): Payer: Self-pay

## 2021-04-18 ENCOUNTER — Other Ambulatory Visit: Payer: Self-pay

## 2021-04-18 ENCOUNTER — Other Ambulatory Visit (HOSPITAL_COMMUNITY): Payer: Self-pay

## 2021-04-18 ENCOUNTER — Other Ambulatory Visit: Payer: Self-pay | Admitting: Cardiovascular Disease

## 2021-04-18 MED ORDER — CARVEDILOL 12.5 MG PO TABS
12.5000 mg | ORAL_TABLET | Freq: Two times a day (BID) | ORAL | 1 refills | Status: DC
  Filled 2021-04-18: qty 180, 90d supply, fill #0

## 2021-04-18 MED FILL — Carvedilol Tab 12.5 MG: ORAL | 15 days supply | Qty: 30 | Fill #1 | Status: CN

## 2021-04-18 MED FILL — Lisinopril Tab 40 MG: ORAL | 30 days supply | Qty: 30 | Fill #1 | Status: AC

## 2021-04-19 ENCOUNTER — Other Ambulatory Visit (HOSPITAL_COMMUNITY): Payer: Self-pay

## 2021-04-19 MED ORDER — VENLAFAXINE HCL ER 75 MG PO CP24
75.0000 mg | ORAL_CAPSULE | Freq: Every day | ORAL | 1 refills | Status: DC
  Filled 2021-04-19 – 2021-05-22 (×2): qty 90, 90d supply, fill #0
  Filled 2021-08-21: qty 90, 90d supply, fill #1

## 2021-04-29 ENCOUNTER — Other Ambulatory Visit (HOSPITAL_COMMUNITY): Payer: Self-pay

## 2021-05-22 ENCOUNTER — Other Ambulatory Visit (HOSPITAL_COMMUNITY): Payer: Self-pay

## 2021-06-10 ENCOUNTER — Other Ambulatory Visit: Payer: Self-pay | Admitting: Cardiovascular Disease

## 2021-06-10 ENCOUNTER — Other Ambulatory Visit (HOSPITAL_COMMUNITY): Payer: Self-pay

## 2021-06-10 DIAGNOSIS — I10 Essential (primary) hypertension: Secondary | ICD-10-CM

## 2021-06-10 DIAGNOSIS — Z79899 Other long term (current) drug therapy: Secondary | ICD-10-CM

## 2021-06-10 DIAGNOSIS — E7800 Pure hypercholesterolemia, unspecified: Secondary | ICD-10-CM

## 2021-06-10 MED ORDER — ROSUVASTATIN CALCIUM 40 MG PO TABS
40.0000 mg | ORAL_TABLET | Freq: Every day | ORAL | 3 refills | Status: DC
  Filled 2021-06-10: qty 90, 90d supply, fill #0

## 2021-06-10 MED FILL — Lisinopril Tab 40 MG: ORAL | 30 days supply | Qty: 30 | Fill #2 | Status: AC

## 2021-07-07 ENCOUNTER — Other Ambulatory Visit: Payer: Self-pay | Admitting: Cardiovascular Disease

## 2021-07-08 ENCOUNTER — Other Ambulatory Visit: Payer: Self-pay | Admitting: Cardiovascular Disease

## 2021-07-08 ENCOUNTER — Other Ambulatory Visit (HOSPITAL_COMMUNITY): Payer: Self-pay

## 2021-07-09 ENCOUNTER — Other Ambulatory Visit (HOSPITAL_COMMUNITY): Payer: Self-pay

## 2021-07-09 MED ORDER — CARVEDILOL 12.5 MG PO TABS
12.5000 mg | ORAL_TABLET | Freq: Two times a day (BID) | ORAL | 1 refills | Status: DC
  Filled 2021-07-09: qty 180, 90d supply, fill #0

## 2021-07-09 MED ORDER — LISINOPRIL 40 MG PO TABS
40.0000 mg | ORAL_TABLET | Freq: Every day | ORAL | 10 refills | Status: DC
  Filled 2021-07-09: qty 90, 90d supply, fill #0
  Filled 2021-10-07: qty 90, 90d supply, fill #1
  Filled 2021-10-08: qty 30, 30d supply, fill #1

## 2021-07-19 ENCOUNTER — Other Ambulatory Visit: Payer: Self-pay | Admitting: Obstetrics and Gynecology

## 2021-07-19 DIAGNOSIS — Z1231 Encounter for screening mammogram for malignant neoplasm of breast: Secondary | ICD-10-CM

## 2021-08-22 ENCOUNTER — Other Ambulatory Visit (HOSPITAL_COMMUNITY): Payer: Self-pay

## 2021-08-30 ENCOUNTER — Ambulatory Visit (INDEPENDENT_AMBULATORY_CARE_PROVIDER_SITE_OTHER): Payer: No Typology Code available for payment source | Admitting: Family Medicine

## 2021-08-30 ENCOUNTER — Other Ambulatory Visit: Payer: Self-pay

## 2021-08-30 VITALS — Ht 68.0 in | Wt 205.0 lb

## 2021-08-30 DIAGNOSIS — R269 Unspecified abnormalities of gait and mobility: Secondary | ICD-10-CM | POA: Diagnosis not present

## 2021-08-30 NOTE — Assessment & Plan Note (Addendum)
Discussed with patient that custom orthotics may be helpful for her to provide better arch support and decrease the irritation on her peroneal tendons.  She can follow-up here as needed for further adjustments.  Patient was fitted for a : standard, cushioned, semi-rigid orthotic. The orthotic was heated and afterward the patient stood on the orthotic blank positioned on the orthotic stand. The patient was positioned in subtalar neutral position and 10 degrees of ankle dorsiflexion in a weight bearing stance. After completion of molding, a stable base was applied to the orthotic blank. The blank was ground to a stable position for weight bearing. Size: 9 Base: blue EVA Posting: left 5th ray post Additional orthotic padding: none  Total time spent with the patient was 30 minutes with greater than 50% of the time spent in face-to-face consultation discussing orthotic construction, instruction, and sitting. Gait was neutral with orthotics in place. Patient found them to be comfortable. Follow-up as needed.

## 2021-08-30 NOTE — Progress Notes (Signed)
   Kimberly Koch is a 55 y.o. female who presents to [facility] today for the following:  Gait abnormality Patient presents for custom orthotics She has a prior history of peroneal tendon transplant and was scanned at [facility] yesterday and found to have some fluid surrounding her peroneus longus She reports chronic pain off and on in the lateral ankle She has tried over-the-counter arch supports without good improvement   PMH reviewed.  ROS as above. Medications reviewed.  Exam:  Ht '5\' 8"'$  (1.727 m)   Wt 205 lb (93 kg)   BMI 31.17 kg/m  Gen: Well NAD MSK:  Bilateral Feet:  Inspection:  No obvious bony deformity b/l.  No swelling, erythema, or bruising b/l.  Normal arch b/l  Palpation: No tenderness to palpation b/l Neurovascular: N/V intact distally in the lower extremity b/l   No results found.   Assessment and Plan: 1) Abnormality of gait Discussed with patient that custom orthotics may be helpful for her to provide better arch support and decrease the irritation on her peroneal tendons.  She can follow-up here as needed for further adjustments.  Patient was fitted for a : standard, cushioned, semi-rigid orthotic. The orthotic was heated and afterward the patient stood on the orthotic blank positioned on the orthotic stand. The patient was positioned in subtalar neutral position and 10 degrees of ankle dorsiflexion in a weight bearing stance. After completion of molding, a stable base was applied to the orthotic blank. The blank was ground to a stable position for weight bearing. Size: 9 Base: blue EVA Posting: left 5th ray post Additional orthotic padding: none  Total time spent with the patient was 30 minutes with greater than 50% of the time spent in face-to-face consultation discussing orthotic construction, instruction, and sitting. Gait was neutral with orthotics in place. Patient found them to be comfortable. Follow-up as needed.   Kimberly Koch, D.O.   PGY-4 [facility]  08/30/2021 12:59 PM  Addendum:  Patient seen in the office by fellow.  Her history, exam, plan of care were precepted with me.  Kimberly Lemon MD Kimberly Koch

## 2021-09-09 ENCOUNTER — Ambulatory Visit
Admission: RE | Admit: 2021-09-09 | Discharge: 2021-09-09 | Disposition: A | Discharge status: Home / Self Care | Payer: No Typology Code available for payment source | Source: Ambulatory Visit | Attending: Obstetrics and Gynecology | Admitting: Obstetrics and Gynecology

## 2021-09-09 ENCOUNTER — Other Ambulatory Visit: Payer: Self-pay

## 2021-09-09 DIAGNOSIS — Z1231 Encounter for screening mammogram for malignant neoplasm of breast: Secondary | ICD-10-CM

## 2021-10-07 ENCOUNTER — Other Ambulatory Visit (HOSPITAL_COMMUNITY): Payer: Self-pay

## 2021-10-07 ENCOUNTER — Other Ambulatory Visit: Payer: Self-pay | Admitting: Cardiovascular Disease

## 2021-10-08 ENCOUNTER — Other Ambulatory Visit (HOSPITAL_COMMUNITY): Payer: Self-pay

## 2021-10-08 MED ORDER — VENLAFAXINE HCL ER 75 MG PO CP24
75.0000 mg | ORAL_CAPSULE | Freq: Every day | ORAL | 1 refills | Status: DC
  Filled 2021-10-08 – 2021-12-03 (×3): qty 90, 90d supply, fill #0
  Filled 2022-03-03: qty 90, 90d supply, fill #1

## 2021-10-08 MED ORDER — AMITRIPTYLINE HCL 25 MG PO TABS
25.0000 mg | ORAL_TABLET | Freq: Every day | ORAL | 1 refills | Status: DC
  Filled 2021-10-08: qty 90, 90d supply, fill #0
  Filled 2022-01-16: qty 90, 90d supply, fill #1

## 2021-10-08 MED ORDER — ROSUVASTATIN CALCIUM 20 MG PO TABS
20.0000 mg | ORAL_TABLET | Freq: Every day | ORAL | 1 refills | Status: DC
  Filled 2021-10-08: qty 90, 90d supply, fill #0
  Filled 2022-04-10: qty 90, 90d supply, fill #1

## 2021-10-08 MED ORDER — LISINOPRIL 20 MG PO TABS
20.0000 mg | ORAL_TABLET | Freq: Every day | ORAL | 1 refills | Status: DC
  Filled 2021-10-08: qty 90, 90d supply, fill #0

## 2021-10-08 MED ORDER — CARVEDILOL 12.5 MG PO TABS
12.5000 mg | ORAL_TABLET | Freq: Two times a day (BID) | ORAL | 1 refills | Status: DC
  Filled 2021-10-08 – 2022-01-28 (×2): qty 180, 90d supply, fill #0
  Filled 2022-04-10: qty 180, 90d supply, fill #1

## 2021-10-08 MED ORDER — LEVOTHYROXINE SODIUM 50 MCG PO TABS
50.0000 ug | ORAL_TABLET | Freq: Every day | ORAL | 1 refills | Status: DC
  Filled 2021-10-08: qty 90, 90d supply, fill #0
  Filled 2022-01-27: qty 90, 90d supply, fill #1

## 2021-10-08 MED ORDER — CHLORTHALIDONE 25 MG PO TABS
25.0000 mg | ORAL_TABLET | Freq: Every day | ORAL | 1 refills | Status: DC
  Filled 2021-10-08: qty 90, 90d supply, fill #0

## 2021-10-08 MED ORDER — AMITRIPTYLINE HCL 25 MG PO TABS
25.0000 mg | ORAL_TABLET | Freq: Every day | ORAL | 1 refills | Status: DC
  Filled 2021-10-08: qty 90, 90d supply, fill #0

## 2021-10-08 MED ORDER — EZETIMIBE 10 MG PO TABS
10.0000 mg | ORAL_TABLET | Freq: Every day | ORAL | 1 refills | Status: DC
  Filled 2021-10-08 – 2022-01-17 (×2): qty 90, 90d supply, fill #0
  Filled 2022-04-11: qty 90, 90d supply, fill #1

## 2021-10-08 MED ORDER — CARVEDILOL 12.5 MG PO TABS
12.5000 mg | ORAL_TABLET | Freq: Two times a day (BID) | ORAL | 1 refills | Status: DC
  Filled 2021-10-08: qty 180, 90d supply, fill #0

## 2021-10-22 ENCOUNTER — Other Ambulatory Visit (HOSPITAL_COMMUNITY): Payer: Self-pay

## 2021-10-22 MED ORDER — LISINOPRIL 40 MG PO TABS
40.0000 mg | ORAL_TABLET | Freq: Every day | ORAL | 0 refills | Status: DC
  Filled 2021-10-22: qty 90, 90d supply, fill #0

## 2021-10-23 ENCOUNTER — Ambulatory Visit: Payer: No Typology Code available for payment source | Admitting: Physician Assistant

## 2021-10-23 ENCOUNTER — Other Ambulatory Visit (HOSPITAL_COMMUNITY): Payer: Self-pay

## 2021-10-23 ENCOUNTER — Other Ambulatory Visit: Payer: Self-pay

## 2021-10-23 DIAGNOSIS — Z1283 Encounter for screening for malignant neoplasm of skin: Secondary | ICD-10-CM | POA: Diagnosis not present

## 2021-10-23 DIAGNOSIS — L570 Actinic keratosis: Secondary | ICD-10-CM

## 2021-10-23 DIAGNOSIS — L821 Other seborrheic keratosis: Secondary | ICD-10-CM | POA: Diagnosis not present

## 2021-10-23 DIAGNOSIS — L309 Dermatitis, unspecified: Secondary | ICD-10-CM

## 2021-10-23 DIAGNOSIS — D485 Neoplasm of uncertain behavior of skin: Secondary | ICD-10-CM

## 2021-10-23 MED ORDER — TRIAMCINOLONE ACETONIDE 0.1 % EX CREA
1.0000 | TOPICAL_CREAM | Freq: Every day | CUTANEOUS | 3 refills | Status: DC | PRN
  Filled 2021-10-23: qty 454, 30d supply, fill #0
  Filled 2022-08-22: qty 454, 30d supply, fill #1

## 2021-10-23 MED ORDER — MUPIROCIN 2 % EX OINT
1.0000 "application " | TOPICAL_OINTMENT | Freq: Two times a day (BID) | CUTANEOUS | 4 refills | Status: DC
  Filled 2021-10-23: qty 22, 10d supply, fill #0

## 2021-10-23 MED ORDER — OPZELURA 1.5 % EX CREA: 1.0000 | TOPICAL_CREAM | Freq: Every day | CUTANEOUS | 6 refills | Status: DC

## 2021-10-23 NOTE — Patient Instructions (Addendum)
[place] [phone]    Biopsy, Surgery (Curettage) & Surgery (Excision) Aftercare Instructions  1. Okay to remove bandage in 24 hours  2. Wash area with soap and water  3. Apply Vaseline to area twice daily until healed (Not Neosporin)  4. Okay to cover with a Band-Aid to decrease the chance of infection or prevent irritation from clothing; also it's okay to uncover lesion at home.  5. Suture instructions: return to our office in 7-10 or 10-14 days for a nurse visit for suture removal. Variable healing with sutures, if pain or itching occurs call our office. It's okay to shower or bathe 24 hours after sutures are given.  6. The following risks may occur after a biopsy, curettage or excision: bleeding, scarring, discoloration, recurrence, infection (redness, yellow drainage, pain or swelling).  7. For questions, concerns and results call our office at [place] before 4pm & Friday before 3pm. Biopsy results will be available in 1 week.

## 2021-10-28 ENCOUNTER — Encounter: Payer: Self-pay | Admitting: Physician Assistant

## 2021-10-28 NOTE — Progress Notes (Signed)
   New Patient   Subjective  Kimberly Koch is a 55 y.o. female who presents for the following: Annual Exam (Here for annual skin exam. Concerns patient is having an eczema flare on arms, elbows. Needs refill on triamcinolone. No other concerns. ).   The following portions of the chart were reviewed this encounter and updated as appropriate:  Tobacco  Allergies  Meds  Problems  Med Hx  Surg Hx  Fam Hx      Objective  Well appearing patient in no apparent distress; mood and affect are within normal limits.  All skin waist up examined.  Left Elbow - Posterior, Right Elbow - Posterior Thin scaly erythematous papules coalescing to plaques.   Left Forearm - Anterior, Right Forearm - Anterior Stuck- crusty plaques.   Left Malar Cheek Hyperkeratotic scale with pink base         Assessment & Plan  Eczema, unspecified type Left Elbow - Posterior; Right Elbow - Posterior  triamcinolone cream (KENALOG) 0.1 % - Left Elbow - Posterior, Right Elbow - Posterior Apply 1 application topically daily as needed.  Seborrheic keratosis (2) Left Forearm - Anterior; Right Forearm - Anterior  Ok to leave if stable.  Neoplasm of uncertain behavior of skin Left Malar Cheek  Skin / nail biopsy Type of biopsy: tangential   Informed consent: discussed and consent obtained   Timeout: patient name, date of birth, surgical site, and procedure verified   Anesthesia: the lesion was anesthetized in a standard fashion   Anesthetic:  1% lidocaine w/ epinephrine 1-100,000 local infiltration Instrument used: flexible razor blade   Hemostasis achieved with: ferric subsulfate   Outcome: patient tolerated procedure well   Post-procedure details: wound care instructions given    Specimen 1 - Surgical pathology Differential Diagnosis: scc vs bcc  Check Margins: No     I, Latori Beggs, PA-C, have reviewed all documentation's for this visit.  The documentation on 10/28/21 for the exam,  diagnosis, procedures and orders are all accurate and complete.

## 2021-10-29 ENCOUNTER — Telehealth: Payer: Self-pay | Admitting: *Deleted

## 2021-10-29 NOTE — Telephone Encounter (Signed)
Pathology to patient.  °

## 2021-10-29 NOTE — Telephone Encounter (Signed)
-----   Message from Warren Danes, [place] sent at 10/29/2021  1:37 PM EST ----- RTC if recurs

## 2021-11-24 ENCOUNTER — Other Ambulatory Visit: Payer: Self-pay | Admitting: Cardiovascular Disease

## 2021-11-25 ENCOUNTER — Other Ambulatory Visit (HOSPITAL_COMMUNITY): Payer: Self-pay

## 2021-11-25 MED ORDER — EZETIMIBE 10 MG PO TABS
10.0000 mg | ORAL_TABLET | Freq: Every day | ORAL | 0 refills | Status: DC
  Filled 2021-11-25: qty 90, 90d supply, fill #0

## 2021-12-03 ENCOUNTER — Other Ambulatory Visit (HOSPITAL_COMMUNITY): Payer: Self-pay

## 2021-12-17 ENCOUNTER — Encounter: Payer: Self-pay | Admitting: Cardiovascular Disease

## 2021-12-17 ENCOUNTER — Other Ambulatory Visit (HOSPITAL_COMMUNITY): Payer: Self-pay

## 2021-12-17 ENCOUNTER — Other Ambulatory Visit: Payer: Self-pay

## 2021-12-17 ENCOUNTER — Ambulatory Visit: Payer: No Typology Code available for payment source | Admitting: Cardiovascular Disease

## 2021-12-17 VITALS — BP 98/60 | HR 71 | Ht 68.0 in | Wt 200.2 lb

## 2021-12-17 DIAGNOSIS — E7800 Pure hypercholesterolemia, unspecified: Secondary | ICD-10-CM | POA: Diagnosis not present

## 2021-12-17 DIAGNOSIS — E039 Hypothyroidism, unspecified: Secondary | ICD-10-CM

## 2021-12-17 DIAGNOSIS — I10 Essential (primary) hypertension: Secondary | ICD-10-CM | POA: Diagnosis not present

## 2021-12-17 DIAGNOSIS — Z8249 Family history of ischemic heart disease and other diseases of the circulatory system: Secondary | ICD-10-CM | POA: Diagnosis not present

## 2021-12-17 MED ORDER — CHLORTHALIDONE 25 MG PO TABS
12.5000 mg | ORAL_TABLET | ORAL | 1 refills | Status: AC | PRN
Start: 2021-12-17 — End: ?
  Filled 2021-12-17: qty 45, 90d supply, fill #0

## 2021-12-17 NOTE — Progress Notes (Incomplete)
Cardiology Office Note    Date:  12/17/2021   ID:  Kimberly Koch, DOB [birth year], MRN [id number]  PCP:  Aura Dials, PA-C  Cardiologist:  Shelva Majestic, MD   F/U cardiology evaluation initiallly referred through the courtesy of Mattie Marlin, PA-C and Dr. Melford Aase.  History of Present Illness:  Kimberly Koch is a 56 y.o. female has a hypertension for at least 33 years and strong family history for heart disease an.  She was referred by Mattie Marlin, PA for cardiology evaluation following recent hypertension exacerbation.  I saw her for initial evaluation on October 11, 2019 and  last saw her in May 2021.She presents for a 6 month follow-up evaluation.  Kimberly Koch admits to at least a 22-year history of hypertension.  In the past she had been on numerous medications including amlodipine, ARB therapy, recently was on lisinopril 20 mg daily and recently was started on metoprolol succinate initially at 25 mg. Due to continued blood pressure elevation, her Toprol-XL has been titrated to 50 mg and further titrated to 100 mg  by Mattie Marlin, PA. She has a history of significant hyperlipidemia with laboratory in June 2020 showing a total cholesterol of 239, LDL 162, triglycerides 171 and HDL 45.  She has been recently started on Crestor 5 mg.  She also has a history of hypothyroidism on levothyroxine 50 mcg and is on Effexor XR in addition to amitriptyline 25 mg at bedtime.  She has a significant family history for heart disease with her father dying at age 38 but having heart disease starting in his 55s will he underwent CABG revascularization in his 76s.  Her mother has a history of hyperlipidemia and due to chronic smoking has developed pulmonary fibrosis.  Maternal grandfather had heart disease and died at age 52.  She has a sister who is 27 years old.  When I initially saw her in October 2020, her blood pressures were improved but still mildly increased based on new hypertensive guidelines.   As result I recommended further titration of lisinopril to 40 mg daily.  I scheduled her for an echo Doppler study.  I also discussed with her data regarding subclinical atherosclerosis with elevated LDL levels in June 2020 at 162.  At time she was only on Crestor 5 mg and I recommended titration to at least 20 mg.    She underwent an echo Doppler study in October 25, 2019 which showed an EF of 60 to 65% with grade 1 diastolic dysfunction.  There was mild dilation of her ascending aorta at 40 mm.  There was mild aortic sclerosis without stenosis.  She had normal pulmonary pressures.  She was subsequently seen by Joslyn Hy, Pharm.D. and hypertension clinic and chlorthalidone 25 mg was added to her medical regimen.  On simvastatin 20 mg her LDL dropped from 162 down to 106.  I saw her on January 11, 2020 at which time she remained stable and denied chest pain or shortness of breath.  She began to notice episodes of lightheadedness and low blood pressure.  During that evaluation I recommended that she reduce her chlorthalidone from 25 mg down to 12.5 mg daily.  She continues to be on levothyroxine for hypothyroidism.  She also had recently been started on Effexor XR and I discussed with her that this can also have some potential orthostatic hypotensive effects.  She underwent follow-up laboratory on April 13, 2020.  Total cholesterol was 204 HDL 50 LDL 132.  I last saw her on Apr 27, 2020. She was taking rosuvastatin at 20 mg instead of 40 mg and has tolerated this better due to some aches at the 40 mg dose.  She denied chest pain.  She denied palpitations.  During that evaluation I recommended the addition of Zetia to her medical regimen.  Her blood pressure was low and I recommended she discontinue chlorthalidone but continue lisinopril 40 mg in addition to metoprolol succinate 100 mgs.  Over the past several months, she has been feeling well.  She continues to be very busy at work.  She also was  caring for her boyfriend who had discitis of L3-L4 and required significant antibiotic therapy.  She states her blood pressure at home typically runs around 116/75.  She denies any chest pain or palpitations.  Most recent lipid studies in August 2021 now showed an LDL cholesterol at 70.  Triglycerides were 152 with total cholesterol 140.  Renal function was stable.  She apparently was taken off metoprolol succinate due to significant dreams with possible night terrors and is now on carvedilol for beta-blocker therapy.  She presents for evaluation.   Past medical/surgical history is also notable for bilateral TMJ surgery, bilateral ankle surgery, and nasal antrostomy x2.  Current Medications: Outpatient Medications Prior to Visit  Medication Sig Dispense Refill   amitriptyline (ELAVIL) 25 MG tablet Take 1 tablet (25 mg total) by mouth at bedtime. 90 tablet 1   carvedilol (COREG) 12.5 MG tablet Take 1 tablet (12.5 mg total) by mouth 2 (two) times daily. 90 tablet 1   celecoxib (CELEBREX) 200 MG capsule Take by mouth.     ezetimibe (ZETIA) 10 MG tablet Take 1 tablet (10 mg total) by mouth daily. 90 tablet 1   levothyroxine (SYNTHROID) 50 MCG tablet Take 1 tablet (50 mcg total) by mouth daily. 90 tablet 1   lisinopril (ZESTRIL) 40 MG tablet Take 1 tablet (40 mg total) by mouth daily. 90 tablet 0   meloxicam (MOBIC) 15 MG tablet TAKE 1 TABLET BY MOUTH ONCE A DAY 30 tablet 0   rosuvastatin (CRESTOR) 20 MG tablet Take 1 tablet (20 mg total) by mouth at bedtime. 90 tablet 1   Ruxolitinib Phosphate (OPZELURA) 1.5 % CREA Apply 1 application topically daily. 60 g 6   tiZANidine (ZANAFLEX) 4 MG tablet tizanidine 4 mg tablet  TAKE 1 TABLET BY MOUTH THREE TIMES A DAY AS NEEDED FOR SPASM     triamcinolone cream (KENALOG) 0.1 % Apply 1 application topically daily as needed. 454 g 3   venlafaxine XR (EFFEXOR-XR) 75 MG 24 hr capsule Take 1 capsule (75 mg total) by mouth daily. 90 capsule 1   amitriptyline  (ELAVIL) 25 MG tablet Take 1 tablet (25 mg total) by mouth at bedtime. 90 tablet 1   carvedilol (COREG) 12.5 MG tablet Take one tablet (12.5 mg dose) by mouth 2 (two) times daily. 180 tablet 1   chlorthalidone (HYGROTON) 25 MG tablet Pt takes 1/2 tablet every other day 90 tablet 2   chlorthalidone (HYGROTON) 25 MG tablet Take 1 tablet (25 mg total) by mouth daily. 90 tablet 1   ezetimibe (ZETIA) 10 MG tablet Take 1 tablet (10 mg total) by mouth daily. 90 tablet 0   levothyroxine (SYNTHROID, LEVOTHROID) 50 MCG tablet Take one every morning before breakfast 90 tablet 2   lisinopril (ZESTRIL) 40 MG tablet Take 1 tablet (40 mg total) by mouth daily. 30 tablet 10   meloxicam (MOBIC) 15 MG tablet  Take 15 mg by mouth daily.     mupirocin ointment (BACTROBAN) 2 % Apply 1 application topically 2 (two) times daily. Apply to face 1-2 times daily 22 g 4   ondansetron (ZOFRAN) 4 MG tablet Take 4 mg by mouth every 6 (six) hours as needed.     rosuvastatin (CRESTOR) 40 MG tablet Take 1 tablet (40 mg total) by mouth daily at 6 PM. 90 tablet 3   venlafaxine XR (EFFEXOR-XR) 75 MG 24 hr capsule Take by mouth.     chlorthalidone (HYGROTON) 25 MG tablet Take 1/2 tablet by mouth every other day 90 tablet 2   venlafaxine XR (EFFEXOR-XR) 75 MG 24 hr capsule TAKE 1 CAPSULE BY MOUTH DAILY. 90 capsule 0   No facility-administered medications prior to visit.     Allergies:   Amlodipine, Demerol [meperidine], Metoprolol succinate [metoprolol], Percocet [oxycodone-acetaminophen], and Sulfa antibiotics   Social History   Socioeconomic History   Marital status: Divorced    Spouse name: Not on file   Number of children: Not on file   Years of education: Not on file   Highest education level: Not on file  Occupational History   Not on file  Tobacco Use   Smoking status: Never   Smokeless tobacco: Never  Vaping Use   Vaping Use: Never used  Substance and Sexual Activity   Alcohol use: Yes    Alcohol/week: 1.0  standard drink    Types: 1 Cans of beer per week   Drug use: Not Currently   Sexual activity: Not on file  Other Topics Concern   Not on file  Social History Narrative   Not on file   Social Determinants of Health   Financial Resource Strain: Not on file  Food Insecurity: Not on file  Transportation Needs: Not on file  Physical Activity: Not on file  Stress: Not on file  Social Connections: Not on file    Socially she is divorced x2 with her last divorce 5 years ago.  She lives by herself.  She is an Pensions consultant at [employer].   Family History:  The patient's family history includes Breast cancer in her maternal grandmother; Colon polyps in her father; Crohn's disease in her father.  Her mother is living at age 12 and has hyperlipidemia, rheumatoid arthritis, and pulmonary fibrosis; father died at age 38 but had history of CABG revascularization, stroke and AV malformation.  A sister is living at age 18  ROS General: Negative; No fevers, chills, or night sweats;  HEENT: Negative; No changes in vision or hearing, sinus congestion, difficulty swallowing Pulmonary: Negative; No cough, wheezing, shortness of breath, hemoptysis Cardiovascular: Negative; No chest pain, presyncope, syncope, palpitations GI: Negative; No nausea, vomiting, diarrhea, or abdominal pain GU: Negative; No dysuria, hematuria, or difficulty voiding Musculoskeletal: Negative; no myalgias, joint pain, or weakness Hematologic/Oncology: Negative; no easy bruising, bleeding Endocrine: Positive for hypothyroidism Neuro: Negative; no changes in balance, headaches Skin: Negative; No rashes or skin lesions Psychiatric: Mild anxiety/depression Sleep: Negative; No snoring, daytime sleepiness, hypersomnolence, bruxism, restless legs, hypnogognic hallucinations, no cataplexy Other comprehensive 14 point system review is negative.   PHYSICAL EXAM:   VS:  BP 98/60    Pulse 71    Ht 5' 8" (1.727 m)     Wt 200 lb 3.2 oz (90.8 kg)    SpO2 95%    BMI 30.44 kg/m     Repeat blood pressure by me was 112/78 supine and 112/76 standing  Wt  Readings from Last 3 Encounters:  12/17/21 200 lb 3.2 oz (90.8 kg)  08/30/21 205 lb (93 kg)  01/31/21 209 lb (94.8 kg)     BP 98/60    Pulse 71    Ht 5' 8" (1.727 m)    Wt 200 lb 3.2 oz (90.8 kg)    SpO2 95%    BMI 30.44 kg/m  General: Alert, oriented, no distress.  Skin: normal turgor, no rashes, warm and dry HEENT: Normocephalic, atraumatic. Pupils equal round and reactive to light; sclera anicteric; extraocular muscles intact;  Nose without nasal septal hypertrophy Mouth/Parynx benign; Mallinpatti scale 3 Neck: No JVD, no carotid bruits; normal carotid upstroke Lungs: clear to ausculatation and percussion; no wheezing or rales Chest wall: without tenderness to palpitation Heart: PMI not displaced, RRR, s1 s2 normal, 1/6 systolic murmur, no diastolic murmur, no rubs, gallops, thrills, or heaves Abdomen: soft, nontender; no hepatosplenomehaly, BS+; abdominal aorta nontender and not dilated by palpation. Back: no CVA tenderness Pulses 2+ Musculoskeletal: full range of motion, normal strength, no joint deformities Extremities: no clubbing cyanosis or edema, Homan's sign negative  Neurologic: grossly nonfocal; Cranial nerves grossly wnl Psychologic: Normal mood and affect   Studies/Labs Reviewed:   December 17, 2021 ECG (independently read by me):  NSR at 71; NST changes  November 15, 2020 ECG (independently read by me): NSR at 55, nonspecific T wave abnormality   Apr 17, 2020 ECG (independently read by me): Sinus bradycardia 59 bpm.  No ectopy.  Normal intervals.  January 11, 2020 ECG (independently read by me): Normal sinus rhythm at 69 bpm.  No ectopy.  Normal intervals  October 11, 2019 ECG (independently read by me): Normal sinus rhythm at 64 bpm.  No ectopy.  Normal intervals.  Recent Labs: BMP Latest Ref Rng & Units 11/22/2019  Glucose 65 - 99  mg/dL 94  BUN 6 - 24 mg/dL 17  Creatinine 0.57 - 1.00 mg/dL 0.59  BUN/Creat Ratio 9 - 23 29(H)  Sodium 134 - 144 mmol/L 136  Potassium 3.5 - 5.2 mmol/L 3.8  Chloride 96 - 106 mmol/L 96  CO2 20 - 29 mmol/L 24  Calcium 8.7 - 10.2 mg/dL 9.5     Hepatic Function Latest Ref Rng & Units 11/22/2019  Total Protein 6.0 - 8.5 g/dL 6.7  Albumin 3.8 - 4.9 g/dL 4.5  AST 0 - 40 IU/L 17  ALT 0 - 32 IU/L 20  Alk Phosphatase 39 - 117 IU/L 99  Total Bilirubin 0.0 - 1.2 mg/dL 0.3    No flowsheet data found. No results found for: MCV No results found for: TSH No results found for: HGBA1C   BNP No results found for: BNP  ProBNP No results found for: PROBNP   Lipid Panel     Component Value Date/Time   CHOL 175 11/22/2019 0936   TRIG 124 11/22/2019 0936   HDL 47 11/22/2019 0936   CHOLHDL 3.7 11/22/2019 0936   LDLCALC 106 (H) 11/22/2019 0936   LABVLDL 22 11/22/2019 0936     RADIOLOGY: No results found.   Additional studies/ records that were reviewed today include:  I reviewed the records of Mattie Marlin, [place] from [facility].    ASSESSMENT:    No diagnosis found.   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  She has a history of hypertension of  at least 23 years and has been on several types of therapy but apparently had a reaction to amlodipine and was taken off ARB therapy.  When I initially saw her, she was on lisinopril 20 mg and Toprol-XL had been initiated and titrated up to 100 mg by Mattie Marlin.  During that initial evaluation I recommended further titration of lisinopril to 40 mg with continued blood pressure elevation.  Her echo Doppler study demonstrates normal systolic function with EF at 60 to 65% and grade 1 diastolic dysfunction.  There was evidence for mild aortic valve sclerosis and mild dilatation of ascending  aorta measuring 40 mm.  I had a long discussion with her regarding subclinical atherosclerosis and with LDL cholesterol in June 2020 at 162 for which she was started on Crestor 5 mg.  She had undergone subsequent additional titrations and apparently now is on a regimen consisting of rosuvastatin 40 mg in addition to Zetia 10 mg.  Her most recent LDL cholesterol is now excellent at 70.  Her blood pressure now is stable.  When I last saw her I discontinued daily chlorthalidone and she states she now takes just 12.5 mg every other day.  She continues to be on lisinopril 40 mg daily and carvedilol 12.5 mg twice a day.  Her ECG shows sinus bradycardia 55 bpm.  She is asymptomatic and denies palpitations.  She was under some increased stress taking care of her boyfriend who had developed significant L3-L4 discitis.  She also works long hours at work.  She has hypothyroidism on levothyroxine 50 mcg.  Since I last saw her, her weight is increased from 193 to 202.  She has recently started the [facility] program to aid with diet and weight loss.  She has a follow-up appointment to see her primary provider Mattie Marlin, NP in 3 months.  As long as she remains stable, I will see her in 1 year for follow-up evaluation or sooner as needed..    Medication Adjustments/Labs and Tests Ordered: Current medicines are reviewed at length with the patient today.  Concerns regarding medicines are outlined above.  Medication changes, Labs and Tests ordered today are listed in the Patient Instructions below.  There are no Patient Instructions on file for this visit.   Signed, Shelva Majestic, MD  12/17/2021 3:18 PM    [facility] [address], [place], [place], [place]  [address] Phone: [phone]

## 2021-12-17 NOTE — Progress Notes (Signed)
Cardiology Office Note    Date:  12/21/2021   ID:  Kimberly Koch, DOB [birth year], MRN [id number]  PCP:  Aura Dials, PA-C  Cardiologist:  Shelva Majestic, MD   One year F/U cardiology evaluation initiallly referred through the courtesy of Mattie Marlin, PA-C and Dr. Melford Aase.  History of Present Illness:  Kimberly Koch is a 56 y.o. female has a longstanding history of hypertension  and strong family history for heart disease.  Kimberly Koch was referred by Mattie Marlin, PA for cardiology evaluation following recent hypertension exacerbation.  I saw her for initial evaluation on October 11, 2019 and  last saw her in May 2021.Kimberly Koch presents for a 6 month follow-up evaluation.  When I initially saw her, Kimberly Koch admitted to at least a 22-year history of hypertension.  In the past Kimberly Koch had been on numerous medications including amlodipine, ARB therapy, recently was on lisinopril 20 mg daily and recently was started on metoprolol succinate initially at 25 mg. Due to continued blood pressure elevation, her Toprol-XL has been titrated to 50 mg and further titrated to 100 mg  by Mattie Marlin, PA. Kimberly Koch has a history of significant hyperlipidemia with laboratory in June 2020 showing a total cholesterol of 239, LDL 162, triglycerides 171 and HDL 45.  Kimberly Koch has been recently started on Crestor 5 mg.  Kimberly Koch also has a history of hypothyroidism on levothyroxine 50 mcg and is on Effexor XR in addition to amitriptyline 25 mg at bedtime.  Kimberly Koch has a significant family history for heart disease with her father dying at age 69 but having heart disease starting in his 80s will he underwent CABG revascularization in his 36s.  Her mother has a history of hyperlipidemia and due to chronic smoking has developed pulmonary fibrosis.  Maternal grandfather had heart disease and died at age 18.  Kimberly Koch has a sister who is 28 years old.  When I initially saw her in October 2020, her blood pressures were improved but still mildly increased  based on new hypertensive guidelines.  As result I recommended further titration of lisinopril to 40 mg daily.  I scheduled her for an echo Doppler study.  I also discussed with her data regarding subclinical atherosclerosis with elevated LDL levels in June 2020 at 162.  At time Kimberly Koch was only on Crestor 5 mg and I recommended titration to at least 20 mg.    Kimberly Koch underwent an echo Doppler study in October 25, 2019 which showed an EF of 60 to 65% with grade 1 diastolic dysfunction.  There was mild dilation of her ascending aorta at 40 mm.  There was mild aortic sclerosis without stenosis.  Kimberly Koch had normal pulmonary pressures.  Kimberly Koch was subsequently seen by Joslyn Hy, Pharm.D. and hypertension clinic and chlorthalidone 25 mg was added to her medical regimen.  On simvastatin 20 mg her LDL dropped from 162 down to 106.  I saw her on January 11, 2020 at which time Kimberly Koch remained stable and denied chest pain or shortness of breath.  Kimberly Koch began to notice episodes of lightheadedness and low blood pressure.  During that evaluation I recommended that Kimberly Koch reduce her chlorthalidone from 25 mg down to 12.5 mg daily.  Kimberly Koch continues to be on levothyroxine for hypothyroidism.  Kimberly Koch also had recently been started on Effexor XR and I discussed with her that this can also have some potential orthostatic hypotensive effects.  Kimberly Koch underwent follow-up laboratory on April 13, 2020.  Total cholesterol was 204 HDL  50 LDL 132.  I last saw her on Apr 27, 2020. Kimberly Koch was taking rosuvastatin at 20 mg instead of 40 mg and has tolerated this better due to some aches at the 40 mg dose.  Kimberly Koch denied chest pain.  Kimberly Koch denied palpitations.  During that evaluation I recommended the addition of Zetia to her medical regimen.  Her blood pressure was low and I recommended Kimberly Koch discontinue chlorthalidone but continue lisinopril 40 mg in addition to metoprolol succinate 100 mgs.  I last saw her on November 15, 2020.  At that time Kimberly Koch was feeling fairly  well and remained busy at work.  Kimberly Koch also was caring for her boyfriend who had discitis of L3-L4 and required significant antibiotic therapy.  Kimberly Koch states her blood pressure at home typically runs around 116/75.  Kimberly Koch denied any chest pain or palpitations.  Most recent lipid studies in August 2021 now showed an LDL cholesterol at 70.  Triglycerides were 152 with total cholesterol 140.  Renal function was stable.  Kimberly Koch apparently was taken off metoprolol succinate due to significant dreams with possible night terrors and was now on carvedilol for beta-blocker therapy.    Since I last saw her, Kimberly Koch has continued to be followed by Dr. Melford Aase who recently checked laboratory on October 08, 2021.  Renal function was stable with creatinine 0.62, BUN 15.  Potassium 4.2 LFTs were normal.  TSH was 4.06.  Lipid studies were stable with total cholesterol 135 triglycerides 145 HDL 44 and LDL 66.  Kimberly Koch has had issues with overactive bladder and significantly urinates following her chlorthalidone.  Kimberly Koch has been on a regimen of carvedilol 12.5 mg twice a day, lisinopril 40 mg daily and had been taking chlorthalidone 12.5 mg.  Kimberly Koch is on levothyroxine 50 mcg for hypothyroidism and takes rosuvastatin 20 mg for hyperlipidemia.  Kimberly Koch is on Effexor for anxiety and is taking Elavil at bedtime for sleep.  Kimberly Koch presents for evaluation.   Past medical/surgical history is also notable for bilateral TMJ surgery, bilateral ankle surgery, and nasal antrostomy x2.  Current Medications: Outpatient Medications Prior to Visit  Medication Sig Dispense Refill   amitriptyline (ELAVIL) 25 MG tablet Take 1 tablet (25 mg total) by mouth at bedtime. 90 tablet 1   carvedilol (COREG) 12.5 MG tablet Take 1 tablet (12.5 mg total) by mouth 2 (two) times daily. 90 tablet 1   celecoxib (CELEBREX) 200 MG capsule Take by mouth.     ezetimibe (ZETIA) 10 MG tablet Take 1 tablet (10 mg total) by mouth daily. 90 tablet 1   levothyroxine (SYNTHROID) 50 MCG tablet  Take 1 tablet (50 mcg total) by mouth daily. 90 tablet 1   lisinopril (ZESTRIL) 40 MG tablet Take 1 tablet (40 mg total) by mouth daily. 90 tablet 0   meloxicam (MOBIC) 15 MG tablet TAKE 1 TABLET BY MOUTH ONCE A DAY 30 tablet 0   rosuvastatin (CRESTOR) 20 MG tablet Take 1 tablet (20 mg total) by mouth at bedtime. 90 tablet 1   Ruxolitinib Phosphate (OPZELURA) 1.5 % CREA Apply 1 application topically daily. 60 g 6   tiZANidine (ZANAFLEX) 4 MG tablet tizanidine 4 mg tablet  TAKE 1 TABLET BY MOUTH THREE TIMES A DAY AS NEEDED FOR SPASM     triamcinolone cream (KENALOG) 0.1 % Apply 1 application topically daily as needed. 454 g 3   venlafaxine XR (EFFEXOR-XR) 75 MG 24 hr capsule Take 1 capsule (75 mg total) by mouth daily. 90 capsule 1  amitriptyline (ELAVIL) 25 MG tablet Take 1 tablet (25 mg total) by mouth at bedtime. 90 tablet 1   carvedilol (COREG) 12.5 MG tablet Take one tablet (12.5 mg dose) by mouth 2 (two) times daily. 180 tablet 1   chlorthalidone (HYGROTON) 25 MG tablet Pt takes 1/2 tablet every other day 90 tablet 2   chlorthalidone (HYGROTON) 25 MG tablet Take 1 tablet (25 mg total) by mouth daily. 90 tablet 1   ezetimibe (ZETIA) 10 MG tablet Take 1 tablet (10 mg total) by mouth daily. 90 tablet 0   levothyroxine (SYNTHROID, LEVOTHROID) 50 MCG tablet Take one every morning before breakfast 90 tablet 2   lisinopril (ZESTRIL) 40 MG tablet Take 1 tablet (40 mg total) by mouth daily. 30 tablet 10   meloxicam (MOBIC) 15 MG tablet Take 15 mg by mouth daily.     mupirocin ointment (BACTROBAN) 2 % Apply 1 application topically 2 (two) times daily. Apply to face 1-2 times daily 22 g 4   ondansetron (ZOFRAN) 4 MG tablet Take 4 mg by mouth every 6 (six) hours as needed.     rosuvastatin (CRESTOR) 40 MG tablet Take 1 tablet (40 mg total) by mouth daily at 6 PM. 90 tablet 3   venlafaxine XR (EFFEXOR-XR) 75 MG 24 hr capsule Take by mouth.     chlorthalidone (HYGROTON) 25 MG tablet Take 1/2 tablet by  mouth every other day 90 tablet 2   venlafaxine XR (EFFEXOR-XR) 75 MG 24 hr capsule TAKE 1 CAPSULE BY MOUTH DAILY. 90 capsule 0   No facility-administered medications prior to visit.     Allergies:   Amlodipine, Demerol [meperidine], Metoprolol succinate [metoprolol], Percocet [oxycodone-acetaminophen], and Sulfa antibiotics   Social History   Socioeconomic History   Marital status: Divorced    Spouse name: Not on file   Number of children: Not on file   Years of education: Not on file   Highest education level: Not on file  Occupational History   Not on file  Tobacco Use   Smoking status: Never   Smokeless tobacco: Never  Vaping Use   Vaping Use: Never used  Substance and Sexual Activity   Alcohol use: Yes    Alcohol/week: 1.0 standard drink    Types: 1 Cans of beer per week   Drug use: Not Currently   Sexual activity: Not on file  Other Topics Concern   Not on file  Social History Narrative   Not on file   Social Determinants of Health   Financial Resource Strain: Not on file  Food Insecurity: Not on file  Transportation Needs: Not on file  Physical Activity: Not on file  Stress: Not on file  Social Connections: Not on file    Socially Kimberly Koch is divorced x2 with her last divorce 5 years ago.  Kimberly Koch lives by herself.  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   Family History:  The patient's family history includes Breast cancer in her maternal grandmother; Colon polyps in her father; Crohn's disease in her father.  Her mother is living at age 32 and has hyperlipidemia, rheumatoid arthritis, and pulmonary fibrosis; father died at age 69 but had history of CABG revascularization, stroke and AV malformation.  A sister is living at age 50  ROS General: Negative; No fevers, chills, or night sweats;  HEENT: Negative; No changes in vision or hearing, sinus congestion, difficulty swallowing Pulmonary: Negative; No cough, wheezing, shortness of breath,  hemoptysis Cardiovascular: Negative; No chest pain,  presyncope, syncope, palpitations GI: Negative; No nausea, vomiting, diarrhea, or abdominal pain GU: Negative; No dysuria, hematuria, or difficulty voiding Musculoskeletal: Negative; no myalgias, joint pain, or weakness Hematologic/Oncology: Negative; no easy bruising, bleeding Endocrine: Positive for hypothyroidism Neuro: Negative; no changes in balance, headaches Skin: Negative; No rashes or skin lesions Psychiatric: Mild anxiety/depression Sleep: Negative; No snoring, daytime sleepiness, hypersomnolence, bruxism, restless legs, hypnogognic hallucinations, no cataplexy Other comprehensive 14 point system review is negative.   PHYSICAL EXAM:   VS:  BP 98/60    Pulse 71    Ht _0  (1.727 m)    Wt 200 lb 3.2 oz (90.8 kg)    SpO2 95%    BMI 30.44 kg/m     Repeat blood pressure by me was 106/76 supine and 102/70 standing  Wt Readings from Last 3 Encounters:  12/17/21 200 lb 3.2 oz (90.8 kg)  08/30/21 205 lb (93 kg)  01/31/21 209 lb (94.8 kg)    General: Alert, oriented, no distress.  Skin: normal turgor, no rashes, warm and dry HEENT: Normocephalic, atraumatic. Pupils equal round and reactive to light; sclera anicteric; extraocular muscles intact; Nose without nasal septal hypertrophy Mouth/Parynx benign; Mallinpatti scale 3 Neck: No JVD, no carotid bruits; normal carotid upstroke Lungs: clear to ausculatation and percussion; no wheezing or rales Chest wall: without tenderness to palpitation Heart: PMI not displaced, RRR, s1 s2 normal, 1/6 systolic murmur, no diastolic murmur, no rubs, gallops, thrills, or heaves Abdomen: soft, nontender; no hepatosplenomehaly, BS+; abdominal aorta nontender and not dilated by palpation. Back: no CVA tenderness Pulses 2+ Musculoskeletal: full range of motion, normal strength, no joint deformities Extremities: no clubbing cyanosis or edema, Homan's sign negative  Neurologic: grossly nonfocal;  Cranial nerves grossly wnl Psychologic: Normal mood and affect    Studies/Labs Reviewed:   December 17, 2021 ECG (independently read by me): NSR at 71; nonspecific T change  December 2021 ECG (independently read by me): NSR at 55, nonspecific T wave abnormality   May 4, 2021ECG (independently read by me): Sinus bradycardia 59 bpm.  No ectopy.  Normal intervals.  January 11, 2020 ECG (independently read by me): Normal sinus rhythm at 69 bpm.  No ectopy.  Normal intervals  October 11, 2019 ECG (independently read by me): Normal sinus rhythm at 64 bpm.  No ectopy.  Normal intervals.  Recent Labs: BMP Latest Ref Rng & Units 11/22/2019  Glucose 65 - 99 mg/dL 94  BUN 6 - 24 mg/dL 17  Creatinine 0.57 - 1.00 mg/dL 0.59  BUN/Creat Ratio 9 - 23 29(H)  Sodium 134 - 144 mmol/L 136  Potassium 3.5 - 5.2 mmol/L 3.8  Chloride 96 - 106 mmol/L 96  CO2 20 - 29 mmol/L 24  Calcium 8.7 - 10.2 mg/dL 9.5     Hepatic Function Latest Ref Rng & Units 11/22/2019  Total Protein 6.0 - 8.5 g/dL 6.7  Albumin 3.8 - 4.9 g/dL 4.5  AST 0 - 40 IU/L 17  ALT 0 - 32 IU/L 20  Alk Phosphatase 39 - 117 IU/L 99  Total Bilirubin 0.0 - 1.2 mg/dL 0.3    No flowsheet data found. No results found for: MCV No results found for: TSH No results found for: HGBA1C   BNP No results found for: BNP  ProBNP No results found for: PROBNP   Lipid Panel     Component Value Date/Time   CHOL 175 11/22/2019 0936   TRIG 124 11/22/2019 0936   HDL 47 11/22/2019 0936   CHOLHDL 3.7  11/22/2019 0936   LDLCALC 106 (H) 11/22/2019 0936   LABVLDL 22 11/22/2019 0936     RADIOLOGY: No results found.   Additional studies/ records that were reviewed today include:  I reviewed the records of Mattie Marlin, [place] from [facility].    ASSESSMENT:    1. Essential hypertension   2. Family history of heart disease   3. Hypothyroidism, unspecified type   4. 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  Kimberly Koch has a history of hypertension for almost 25 years and has been on several types of therapy but apparently had a reaction to amlodipine and was taken off ARB therapy.  When I initially saw her, Kimberly Koch was on lisinopril 20 mg and Toprol-XL had been initiated and titrated up to 100 mg by Mattie Marlin.  During that initial evaluation I recommended further titration of lisinopril to 40 mg with continued blood pressure elevation.  An echo Doppler study has demonstrated normal systolic function with EF at 60 to 65% and grade 1 diastolic dysfunction.  There was evidence for mild aortic valve sclerosis and mild dilatation of ascending aorta measuring 40 mm. Previously, I had a long discussion with her regarding subclinical atherosclerosis and with LDL cholesterol in June 2020 at 162 for which Kimberly Koch was started on Crestor 5 mg.  Kimberly Koch had undergone subsequent additional titrations and apparently is now on a regimen of Zetia 10 mg and rosuvastatin 20 mg.  Most recent LDL cholesterol was 70.  Presently, Kimberly Koch has been on carvedilol 12.5 mg twice a day, lisinopril 40 mg daily, and has been taking chlorthalidone 12.5 mg on 2 days/week.  Her blood pressure today is low today.  I have recommended Kimberly Koch discontinue chlorthalidone which Kimberly Koch had been taking on Saturdays and Mondays and only take this on a as needed basis.  Her ECG is stable and demonstrates sinus rhythm at 71 bpm.  Kimberly Koch will monitor her blood pressure.  Mattie Marlin, PA-C is her primary provider who will be checking her laboratory.  We discussed the importance of exercise and optimal diet.  I will see her in 1 year for reevaluation or sooner as needed.   Medication Adjustments/Labs and Tests Ordered: Current medicines are reviewed at length with the patient today.  Concerns regarding medicines are outlined above.   Medication changes, Labs and Tests ordered today are listed in the Patient Instructions below.  Patient Instructions  Medication Instructions:  CHLORTHALIDONE 12.5MG (1/2 TAB) ONLY AS NEEDED  *If you need a refill on your cardiac medications before your next appointment, please call your pharmacy*  Lab Work:   Testing/Procedures:  NONE    NONE  Follow-Up: Your next appointment:  12 month(s) In Person with None   Please call our office 2 months in advance to schedule this appointment   At [facility], you and your health needs are our priority.  As part of our continuing mission to provide you with exceptional heart care, we have created designated Provider Care Teams.  These Care Teams include your primary Cardiologist (physician) and Advanced Practice Providers (APPs -  Physician Assistants and Nurse Practitioners) who all work together to provide you with the care you need, when you need it.     Signed, Shelva Majestic, MD  12/21/2021 12:15 PM    [facility] [address], [place], [place]  [address] Phone: [phone]

## 2021-12-17 NOTE — Patient Instructions (Addendum)
Medication Instructions:  CHLORTHALIDONE 12.5MG  (1/2 TAB) ONLY AS NEEDED  *If you need a refill on your cardiac medications before your next appointment, please call your pharmacy*  Lab Work:   Testing/Procedures:  NONE    NONE  Follow-Up: Your next appointment:  12 month(s) In Person with None   Please call our office 2 months in advance to schedule this appointment   At [facility], you and your health needs are our priority.  As part of our continuing mission to provide you with exceptional heart care, we have created designated Provider Care Teams.  These Care Teams include your primary Cardiologist (physician) and Advanced Practice Providers (APPs -  Physician Assistants and Nurse Practitioners) who all work together to provide you with the care you need, when you need it.

## 2021-12-21 ENCOUNTER — Encounter: Payer: Self-pay | Admitting: Cardiovascular Disease

## 2021-12-25 ENCOUNTER — Other Ambulatory Visit (HOSPITAL_COMMUNITY): Payer: Self-pay

## 2022-01-17 ENCOUNTER — Other Ambulatory Visit (HOSPITAL_COMMUNITY): Payer: Self-pay

## 2022-01-28 ENCOUNTER — Other Ambulatory Visit (HOSPITAL_COMMUNITY): Payer: Self-pay

## 2022-01-28 ENCOUNTER — Telehealth: Payer: Self-pay

## 2022-01-28 NOTE — Telephone Encounter (Signed)
Received call from Lucas to clarify how many carvedilol 12.5 mg tablets to give to pt since she is taking it twice daily. Approved 180 tablets for 90 days.

## 2022-02-23 IMAGING — MG DIGITAL SCREENING BILAT W/ TOMO W/ CAD
8 series · 8 of 24 positions shown · non-contrast
Comparison: Previous exam(s).

CLINICAL DATA: Screening.

EXAM:
DIGITAL SCREENING BILATERAL MAMMOGRAM WITH TOMO AND CAD

[L CC synth-2D]
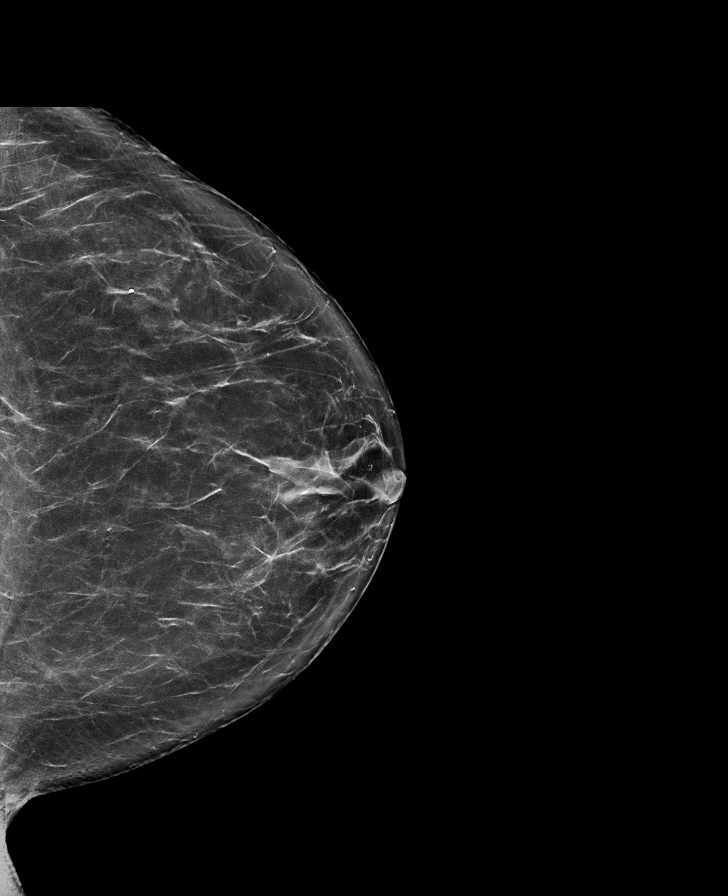

[R MLO synth-2D]
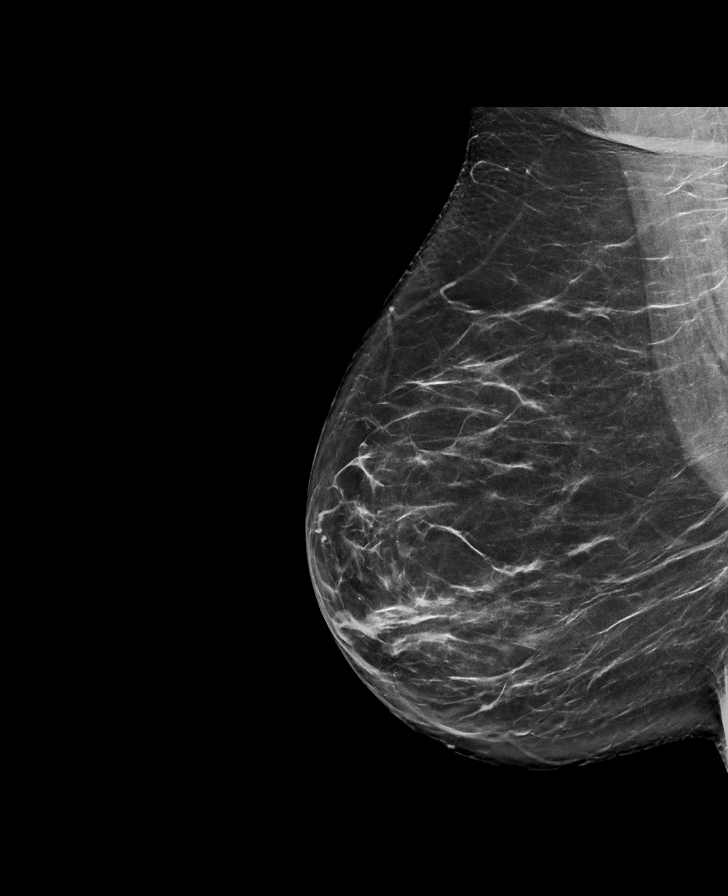

[R CC synth-2D]
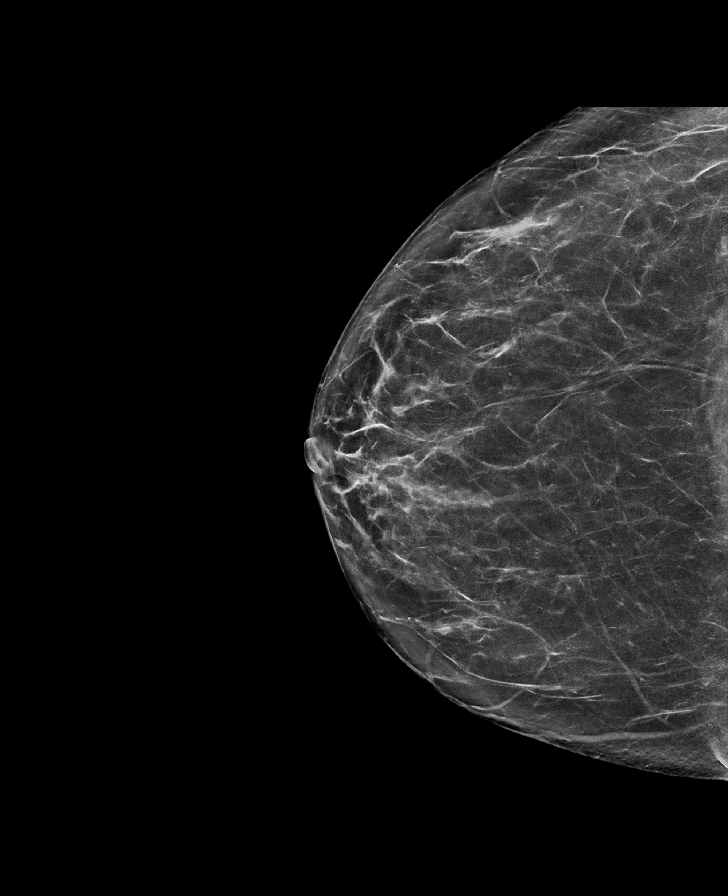

[L MLO synth-2D]
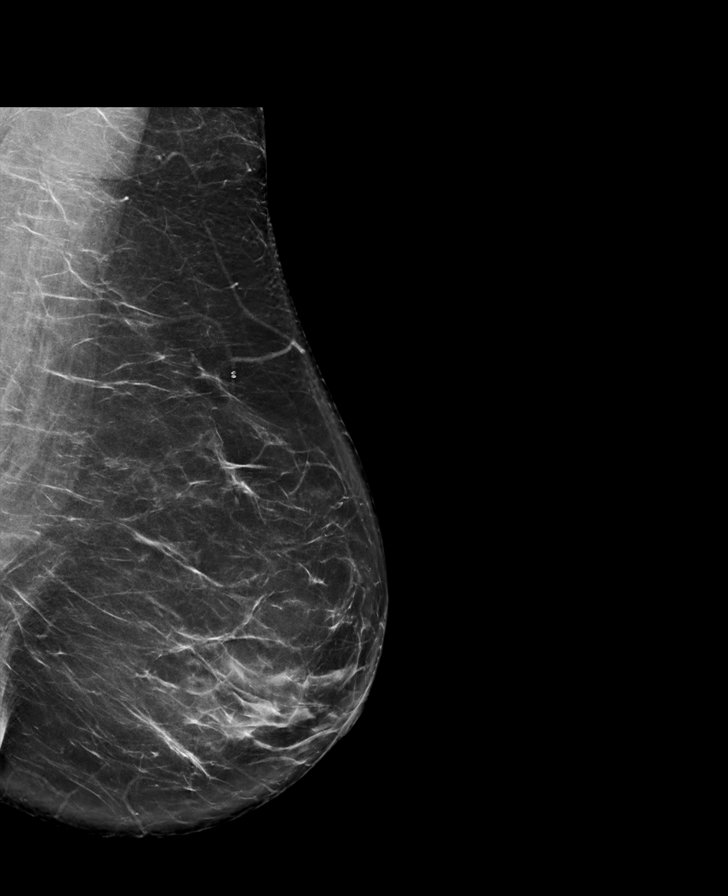

[R CC tomo · tomo slice 37/74.0]
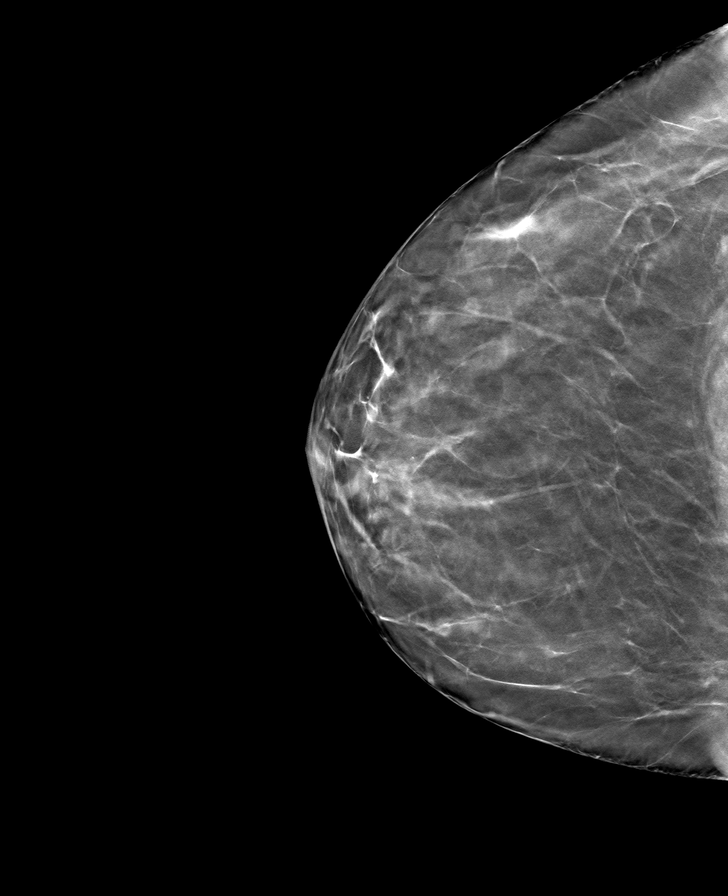

[L CC tomo · tomo slice 39/76.0]
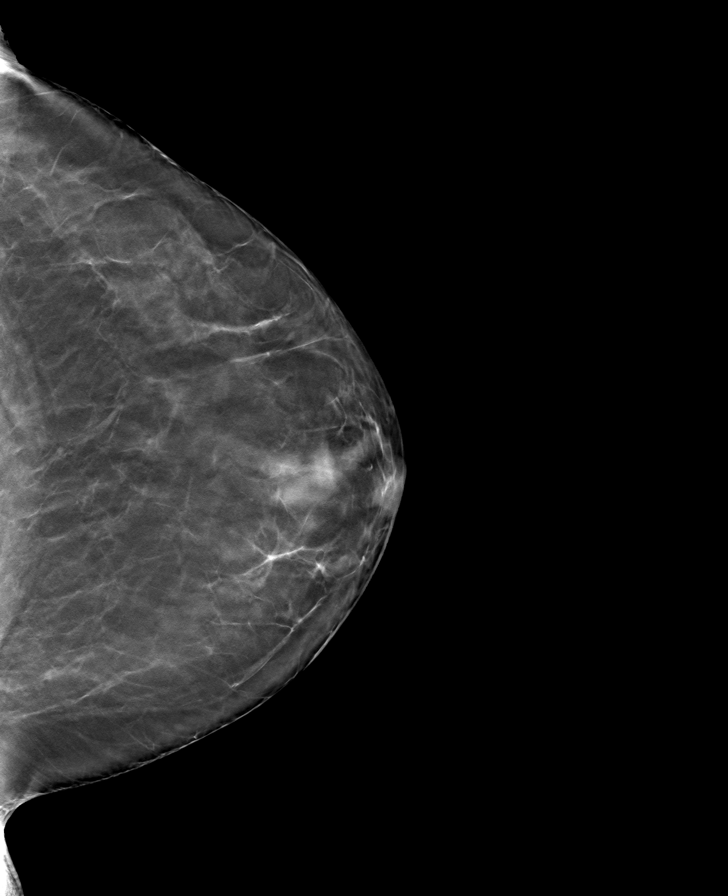

[L MLO tomo · tomo slice 43/86.0]
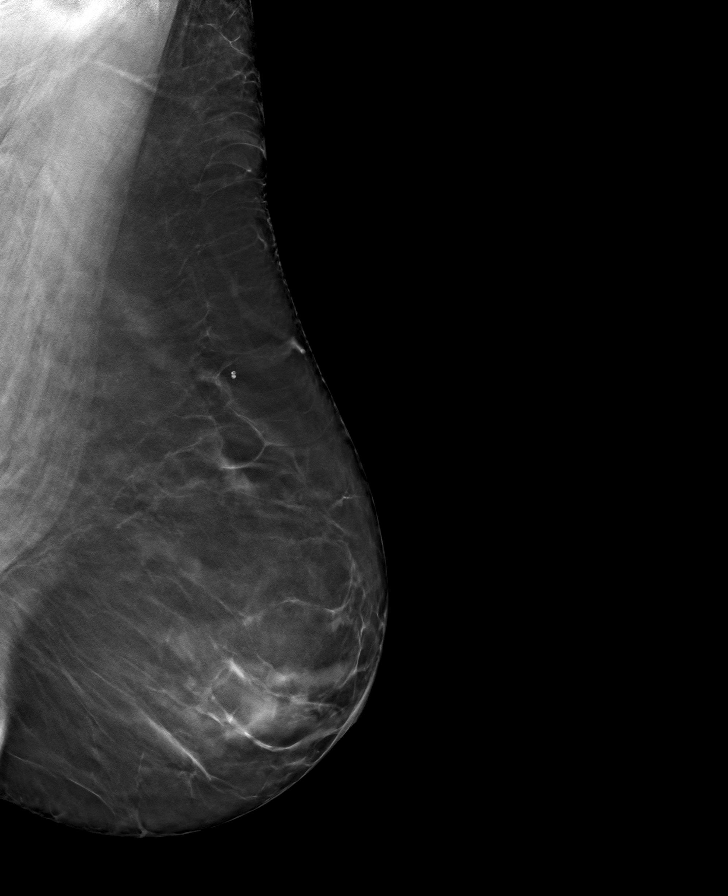

[R MLO tomo · tomo slice 43/84.0]
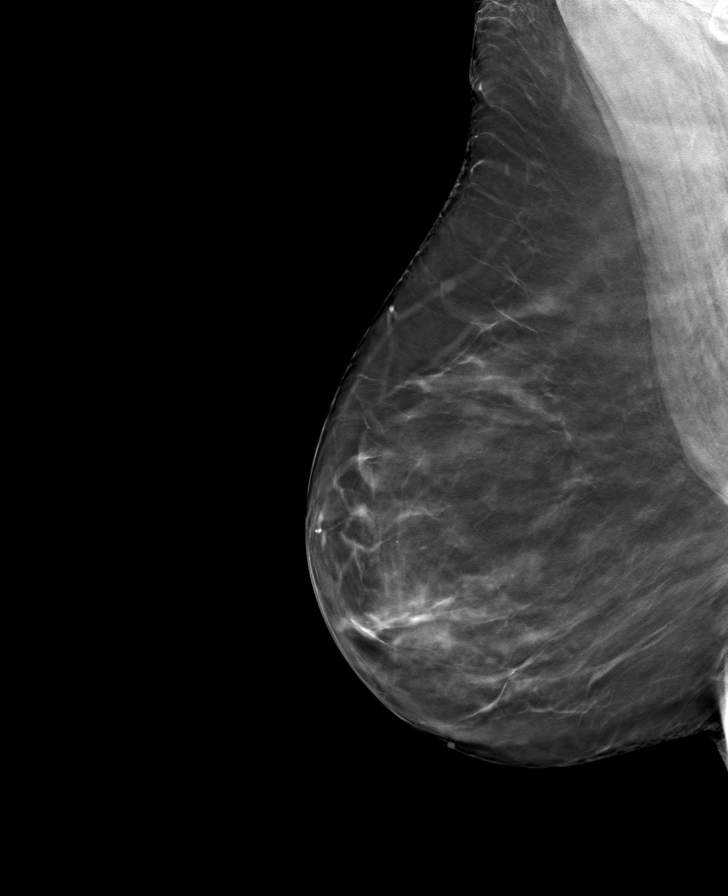

[8 of 24 positions shown; findings below may reference images not displayed]

ACR Breast Density Category b: There are scattered areas of
fibroglandular density.
FINDINGS: There are no findings suspicious for malignancy. Images were
processed with CAD.
IMPRESSION: No mammographic evidence of malignancy. A result letter of this
screening mammogram will be mailed directly to the patient.

RECOMMENDATION:
Screening mammogram in one year. (Code:[id number])

BI-RADS CATEGORY  1: Negative.

## 2022-03-03 ENCOUNTER — Other Ambulatory Visit (HOSPITAL_COMMUNITY): Payer: Self-pay

## 2022-03-04 ENCOUNTER — Other Ambulatory Visit (HOSPITAL_COMMUNITY): Payer: Self-pay

## 2022-03-04 MED ORDER — LISINOPRIL 40 MG PO TABS
40.0000 mg | ORAL_TABLET | Freq: Every day | ORAL | 0 refills | Status: DC
  Filled 2022-03-04: qty 90, 90d supply, fill #0

## 2022-03-12 ENCOUNTER — Ambulatory Visit: Payer: No Typology Code available for payment source | Admitting: Physician Assistant

## 2022-04-10 ENCOUNTER — Other Ambulatory Visit (HOSPITAL_COMMUNITY): Payer: Self-pay

## 2022-04-10 MED ORDER — AMITRIPTYLINE HCL 25 MG PO TABS
25.0000 mg | ORAL_TABLET | Freq: Every day | ORAL | 1 refills | Status: DC
  Filled 2022-04-10: qty 90, 90d supply, fill #0
  Filled 2022-07-21: qty 90, 90d supply, fill #1

## 2022-04-11 ENCOUNTER — Other Ambulatory Visit (HOSPITAL_COMMUNITY): Payer: Self-pay

## 2022-05-08 ENCOUNTER — Other Ambulatory Visit (HOSPITAL_COMMUNITY): Payer: Self-pay

## 2022-05-09 ENCOUNTER — Other Ambulatory Visit (HOSPITAL_COMMUNITY): Payer: Self-pay

## 2022-05-09 MED ORDER — CELECOXIB 100 MG PO CAPS
100.0000 mg | ORAL_CAPSULE | Freq: Two times a day (BID) | ORAL | 1 refills | Status: DC | PRN
  Filled 2022-05-09: qty 60, 30d supply, fill #0
  Filled 2022-08-24: qty 60, 30d supply, fill #1

## 2022-05-16 ENCOUNTER — Other Ambulatory Visit (HOSPITAL_COMMUNITY): Payer: Self-pay

## 2022-05-16 MED ORDER — MOUNJARO 2.5 MG/0.5ML ~~LOC~~ SOAJ
2.5000 mg | SUBCUTANEOUS | 0 refills | Status: DC
  Filled 2022-05-16: qty 2, 28d supply, fill #0

## 2022-05-21 ENCOUNTER — Other Ambulatory Visit (HOSPITAL_COMMUNITY): Payer: Self-pay

## 2022-05-30 ENCOUNTER — Other Ambulatory Visit (HOSPITAL_COMMUNITY): Payer: Self-pay

## 2022-05-30 MED ORDER — LEVOTHYROXINE SODIUM 50 MCG PO TABS
50.0000 ug | ORAL_TABLET | Freq: Every day | ORAL | 1 refills | Status: DC
  Filled 2022-05-30: qty 90, 90d supply, fill #0
  Filled 2022-08-24: qty 90, 90d supply, fill #1

## 2022-05-30 MED ORDER — LISINOPRIL 40 MG PO TABS
40.0000 mg | ORAL_TABLET | Freq: Every day | ORAL | 0 refills | Status: DC
  Filled 2022-05-30: qty 90, 90d supply, fill #0

## 2022-05-30 MED ORDER — VENLAFAXINE HCL ER 75 MG PO CP24
75.0000 mg | ORAL_CAPSULE | Freq: Every day | ORAL | 1 refills | Status: DC
Start: 2022-05-30 — End: 2022-12-05
  Filled 2022-05-30: qty 90, 90d supply, fill #0
  Filled 2022-08-24: qty 90, 90d supply, fill #1

## 2022-06-19 ENCOUNTER — Other Ambulatory Visit (HOSPITAL_COMMUNITY): Payer: Self-pay

## 2022-06-19 MED ORDER — MOUNJARO 2.5 MG/0.5ML ~~LOC~~ SOAJ
2.5000 mg | SUBCUTANEOUS | 0 refills | Status: DC
  Filled 2022-06-19: qty 2, 28d supply, fill #0

## 2022-06-23 ENCOUNTER — Other Ambulatory Visit (HOSPITAL_COMMUNITY): Payer: Self-pay

## 2022-06-23 MED ORDER — MOUNJARO 2.5 MG/0.5ML ~~LOC~~ SOAJ
2.5000 mg | SUBCUTANEOUS | 0 refills | Status: DC
  Filled 2022-06-23: qty 2, 28d supply, fill #0

## 2022-07-03 ENCOUNTER — Other Ambulatory Visit (HOSPITAL_COMMUNITY): Payer: Self-pay

## 2022-07-03 MED ORDER — MOUNJARO 2.5 MG/0.5ML ~~LOC~~ SOAJ
2.5000 mg | SUBCUTANEOUS | 0 refills | Status: DC
  Filled 2022-07-03: qty 2, 28d supply, fill #0

## 2022-07-03 MED ORDER — ROSUVASTATIN CALCIUM 20 MG PO TABS
20.0000 mg | ORAL_TABLET | Freq: Every day | ORAL | 1 refills | Status: DC
  Filled 2022-07-03 – 2022-07-21 (×2): qty 90, 90d supply, fill #0
  Filled 2022-10-15: qty 90, 90d supply, fill #1

## 2022-07-04 ENCOUNTER — Other Ambulatory Visit (HOSPITAL_COMMUNITY): Payer: Self-pay

## 2022-07-09 ENCOUNTER — Other Ambulatory Visit (HOSPITAL_COMMUNITY): Payer: Self-pay

## 2022-07-10 ENCOUNTER — Other Ambulatory Visit (HOSPITAL_COMMUNITY): Payer: Self-pay

## 2022-07-11 ENCOUNTER — Other Ambulatory Visit (HOSPITAL_COMMUNITY): Payer: Self-pay

## 2022-07-22 ENCOUNTER — Ambulatory Visit: Payer: No Typology Code available for payment source | Admitting: Physician Assistant

## 2022-07-22 ENCOUNTER — Other Ambulatory Visit (HOSPITAL_COMMUNITY): Payer: Self-pay

## 2022-07-22 VITALS — BP 128/88 | HR 66 | Ht 68.0 in | Wt 200.2 lb

## 2022-07-22 DIAGNOSIS — I2510 Atherosclerotic heart disease of native coronary artery without angina pectoris: Secondary | ICD-10-CM

## 2022-07-22 DIAGNOSIS — E785 Hyperlipidemia, unspecified: Secondary | ICD-10-CM

## 2022-07-22 DIAGNOSIS — I7121 Aneurysm of the ascending aorta, without rupture: Secondary | ICD-10-CM

## 2022-07-22 DIAGNOSIS — I10 Essential (primary) hypertension: Secondary | ICD-10-CM | POA: Diagnosis not present

## 2022-07-22 DIAGNOSIS — Z136 Encounter for screening for cardiovascular disorders: Secondary | ICD-10-CM

## 2022-07-22 DIAGNOSIS — I2584 Coronary atherosclerosis due to calcified coronary lesion: Secondary | ICD-10-CM

## 2022-07-22 NOTE — Progress Notes (Unsigned)
Cardiology Office Note:    Date:  07/22/2022   ID:  Kimberly Koch, DOB 1966-08-09, MRN [id number]  PCP:  Spencer, Sara C, [place] Providers Cardiologist:  Shelva Majestic, MD { Click to update primary MD,subspecialty MD or APP then REFRESH:1}    Referring MD: Aura Dials, PA-C   No chief complaint on file. ***  History of Present Illness:    Kimberly Koch is a 56 y.o. female with a hx of longstanding history of hypertension, hyperlipidemia, hypothyroidism and strong family history of heart disease.  In the past, she has been on numerous medications including amlodipine, ARB and most recently lisinopril and metoprolol succinate.  Chlorthalidone was later added.  Her father died at age 17 however had heart disease in his 49s and underwent CABG in his 83s.  Mother had a history of hyperlipidemia and pulmonary fibrosis due to chronic smoking.  Maternal grandfather had heart disease and died at age 28.  Echocardiogram obtained on 10/25/2019 showed EF 60 to 65%, mild LVH, grade 1 DD, trace MR, trivial TR, trivial AI, mildly dilated ascending aorta measuring at 40 mm.  More recently, patient underwent coronary calcium scoring test and [facility] on 04/15/2022 which showed total calcium score of 12 which placed the patient between 50th and 75th percentile for age and sex matched control.  The coronary calcium was divided between LAD and RCA territory, no calcium was in the left circumflex and PDA territory, there was a fusiform ectasia of the ascending aorta measuring at 4.2 cm.  Patient presents today for evaluation of dilated ascending aorta and coronary artery calcification seen on the previous CT scan in May.  Despite borderline elevated coronary calcium score, she does not have any exertional chest pain or worsening dyspnea.  She has been able to trim the bushes at her home for over an hour last week without any exertional symptoms.  Given lack of any anginal symptom, will hold  off on ordering a coronary CT.  As for dilated ascending aorta, I did recommend a AAA Doppler for screening.  Her current aortic aneurysm is measuring at 4.2 cm, we plan to repeat a CTA of aorta in May 2022.  Once she reaches 4.5 cm, will repeat image every 6 months.  Past Medical History:  Diagnosis Date   Hyperlipidemia    Hypertension    Thyroid disease     Past Surgical History:  Procedure Laterality Date   ANKLE RECONSTRUCTION Bilateral    COLONOSCOPY  2016   [facility]   NASAL SEPTUM SURGERY     x2   POLYPECTOMY     TMJ ARTHROPLASTY Bilateral     Current Medications: No outpatient medications have been marked as taking for the 07/22/22 encounter (Appointment) with Almyra Deforest, [place].     Allergies:   Amlodipine, Demerol [meperidine], Metoprolol succinate [metoprolol], Percocet [oxycodone-acetaminophen], and Sulfa antibiotics   Social History   Socioeconomic History   Marital status: Divorced    Spouse name: Not on file   Number of children: Not on file   Years of education: Not on file   Highest education level: Not on file  Occupational History   Not on file  Tobacco Use   Smoking status: Never   Smokeless tobacco: Never  Vaping Use   Vaping Use: Never used  Substance and Sexual Activity   Alcohol use: Yes    Alcohol/week: 1.0 standard drink of alcohol    Types: 1 Cans of beer  per week   Drug use: Not Currently   Sexual activity: Not on file  Other Topics Concern   Not on file  Social History Narrative   Not on file   Social Determinants of Health   Financial Resource Strain: Not on file  Food Insecurity: Not on file  Transportation Needs: Not on file  Physical Activity: Not on file  Stress: Not on file  Social Connections: Not on file     Family History: The patient's ***family history includes Breast cancer in her maternal grandmother; Colon polyps in her father; Crohn's disease in her father. There is no history of Colon cancer, Esophageal cancer, Rectal  cancer, or Stomach cancer.  ROS:   Please see the history of present illness.    *** All other systems reviewed and are negative.  EKGs/Labs/Other Studies Reviewed:    The following studies were reviewed today: ***  EKG:  EKG is ordered today.  The ekg ordered today demonstrates normal sinus rhythm, T wave inversion in V1 and V2, this is unchanged when compared to the previous EKG  Recent Labs: No results found for requested labs within last 365 days.  Recent Lipid Panel    Component Value Date/Time   CHOL 175 11/22/2019 0936   TRIG 124 11/22/2019 0936   HDL 47 11/22/2019 0936   CHOLHDL 3.7 11/22/2019 0936   LDLCALC 106 (H) 11/22/2019 0936     Risk Assessment/Calculations:   {Does this patient have ATRIAL FIBRILLATION?:[phone]}       Physical Exam:    VS:  There were no vitals taken for this visit.    No BP recorded.  {Refresh Note OR Click here to enter BP  :1}***   Wt Readings from Last 3 Encounters:  12/17/21 200 lb 3.2 oz (90.8 kg)  08/30/21 205 lb (93 kg)  01/31/21 209 lb (94.8 kg)     GEN: *** Well nourished, well developed in no acute distress HEENT: Normal NECK: No JVD; No carotid bruits LYMPHATICS: No lymphadenopathy CARDIAC: ***RRR, no murmurs, rubs, gallops RESPIRATORY:  Clear to auscultation without rales, wheezing or rhonchi  ABDOMEN: Soft, non-tender, non-distended MUSCULOSKELETAL:  No edema; No deformity  SKIN: Warm and dry NEUROLOGIC:  Alert and oriented x 3 PSYCHIATRIC:  Normal affect   ASSESSMENT:    No diagnosis found. PLAN:    In order of problems listed above:  ***      {Are you ordering a CV Procedure (e.g. stress test, cath, DCCV, TEE, etc)?   Press F2        :[id number]}    Medication Adjustments/Labs and Tests Ordered: Current medicines are reviewed at length with the patient today.  Concerns regarding medicines are outlined above.  No orders of the defined types were placed in this encounter.  No orders of the  defined types were placed in this encounter.   There are no Patient Instructions on file for this visit.   Hilbert Corrigan, [place]  07/22/2022 3:01 PM    [place]

## 2022-07-22 NOTE — Patient Instructions (Signed)
Medication Instructions:  Your physician recommends that you continue on your current medications as directed. Please refer to the Current Medication list given to you today.  *If you need a refill on your cardiac medications before your next appointment, please call your pharmacy*  Lab Work: NONE ordered at this time of appointment   If you have labs (blood work) drawn today and your tests are completely normal, you will receive your results only by: [facility] (if you have MyChart) OR A paper copy in the mail If you have any lab test that is abnormal or we need to change your treatment, we will call you to review the results.  Testing/Procedures: Your physician has requested that you have an abdominal aorta duplex. During this test, an ultrasound is used to evaluate the aorta. Allow 30 minutes for this exam. Do not eat after midnight the day before and avoid carbonated beverages  Non-Cardiac CT Angiography (CTA), is a special type of CT scan that uses a computer to produce multi-dimensional views of major blood vessels throughout the body. In CT angiography, a contrast material is injected through an IV to help visualize the blood vessels  Please schedule for May 2024  Follow-Up: At [facility], you and your health needs are our priority.  As part of our continuing mission to provide you with exceptional heart care, we have created designated Provider Care Teams.  These Care Teams include your primary Cardiologist (physician) and Advanced Practice Providers (APPs -  Physician Assistants and Nurse Practitioners) who all work together to provide you with the care you need, when you need it.   Your next appointment:   6 month(s)  The format for your next appointment:   In Person  Provider:   Shelva Majestic, MD     Other Instructions   Important Information About Sugar

## 2022-07-23 ENCOUNTER — Other Ambulatory Visit (HOSPITAL_COMMUNITY): Payer: Self-pay

## 2022-07-23 ENCOUNTER — Other Ambulatory Visit: Payer: Self-pay | Admitting: Cardiovascular Disease

## 2022-07-23 MED ORDER — EZETIMIBE 10 MG PO TABS
10.0000 mg | ORAL_TABLET | Freq: Every day | ORAL | 1 refills | Status: DC
  Filled 2022-07-23: qty 90, 90d supply, fill #0
  Filled 2022-10-15: qty 90, 90d supply, fill #1

## 2022-07-23 MED ORDER — CARVEDILOL 12.5 MG PO TABS
12.5000 mg | ORAL_TABLET | Freq: Two times a day (BID) | ORAL | 3 refills | Status: DC
  Filled 2022-07-23: qty 180, 90d supply, fill #0
  Filled 2022-10-15: qty 180, 90d supply, fill #1
  Filled 2023-01-11: qty 180, 90d supply, fill #2
  Filled 2023-04-14: qty 180, 90d supply, fill #3

## 2022-07-24 ENCOUNTER — Other Ambulatory Visit (HOSPITAL_COMMUNITY): Payer: Self-pay

## 2022-08-05 ENCOUNTER — Inpatient Hospital Stay (HOSPITAL_COMMUNITY): Admission: RE | Admit: 2022-08-05 | Payer: No Typology Code available for payment source | Source: Ambulatory Visit

## 2022-08-05 ENCOUNTER — Other Ambulatory Visit (HOSPITAL_COMMUNITY): Payer: No Typology Code available for payment source

## 2022-08-06 ENCOUNTER — Inpatient Hospital Stay (HOSPITAL_COMMUNITY): Admission: RE | Admit: 2022-08-06 | Payer: No Typology Code available for payment source | Source: Ambulatory Visit

## 2022-08-06 ENCOUNTER — Other Ambulatory Visit: Payer: Self-pay | Admitting: Physician Assistant

## 2022-08-06 ENCOUNTER — Ambulatory Visit (HOSPITAL_COMMUNITY)
Admission: RE | Admit: 2022-08-06 | Discharge: 2022-08-06 | Disposition: A | Discharge status: Home / Self Care | Payer: No Typology Code available for payment source | Source: Ambulatory Visit | Attending: Cardiovascular Disease | Admitting: Cardiovascular Disease

## 2022-08-06 DIAGNOSIS — I77810 Thoracic aortic ectasia: Secondary | ICD-10-CM | POA: Insufficient documentation

## 2022-08-06 DIAGNOSIS — Z136 Encounter for screening for cardiovascular disorders: Secondary | ICD-10-CM | POA: Insufficient documentation

## 2022-08-22 ENCOUNTER — Other Ambulatory Visit (HOSPITAL_COMMUNITY): Payer: Self-pay

## 2022-08-24 ENCOUNTER — Other Ambulatory Visit (HOSPITAL_COMMUNITY): Payer: Self-pay

## 2022-08-25 ENCOUNTER — Other Ambulatory Visit (HOSPITAL_COMMUNITY): Payer: Self-pay

## 2022-08-25 MED ORDER — LISINOPRIL 40 MG PO TABS
40.0000 mg | ORAL_TABLET | Freq: Every day | ORAL | 0 refills | Status: DC
  Filled 2022-08-25: qty 90, 90d supply, fill #0

## 2022-09-12 ENCOUNTER — Other Ambulatory Visit: Payer: Self-pay | Admitting: Obstetrics and Gynecology

## 2022-09-12 DIAGNOSIS — Z1231 Encounter for screening mammogram for malignant neoplasm of breast: Secondary | ICD-10-CM

## 2022-09-16 ENCOUNTER — Ambulatory Visit
Admission: RE | Admit: 2022-09-16 | Discharge: 2022-09-16 | Disposition: A | Discharge status: Home / Self Care | Payer: No Typology Code available for payment source | Source: Ambulatory Visit | Attending: Obstetrics and Gynecology | Admitting: Obstetrics and Gynecology

## 2022-09-16 ENCOUNTER — Other Ambulatory Visit: Payer: Self-pay | Admitting: Obstetrics and Gynecology

## 2022-09-16 DIAGNOSIS — M810 Age-related osteoporosis without current pathological fracture: Secondary | ICD-10-CM

## 2022-09-16 DIAGNOSIS — Z1231 Encounter for screening mammogram for malignant neoplasm of breast: Secondary | ICD-10-CM

## 2022-10-15 ENCOUNTER — Other Ambulatory Visit (HOSPITAL_COMMUNITY): Payer: Self-pay

## 2022-10-16 ENCOUNTER — Other Ambulatory Visit (HOSPITAL_COMMUNITY): Payer: Self-pay

## 2022-10-16 MED ORDER — AMITRIPTYLINE HCL 25 MG PO TABS
25.0000 mg | ORAL_TABLET | Freq: Every day | ORAL | 1 refills | Status: DC
  Filled 2022-10-16: qty 90, 90d supply, fill #0
  Filled 2023-01-11: qty 90, 90d supply, fill #1

## 2022-11-04 ENCOUNTER — Inpatient Hospital Stay: Admission: RE | Admit: 2022-11-04 | Payer: No Typology Code available for payment source | Source: Ambulatory Visit

## 2022-11-21 ENCOUNTER — Other Ambulatory Visit: Payer: Self-pay | Admitting: Obstetrics and Gynecology

## 2022-11-21 DIAGNOSIS — Z803 Family history of malignant neoplasm of breast: Secondary | ICD-10-CM

## 2022-11-27 ENCOUNTER — Other Ambulatory Visit (HOSPITAL_COMMUNITY): Payer: Self-pay

## 2022-11-28 ENCOUNTER — Other Ambulatory Visit (HOSPITAL_COMMUNITY): Payer: Self-pay

## 2022-12-01 ENCOUNTER — Other Ambulatory Visit (HOSPITAL_COMMUNITY): Payer: Self-pay

## 2022-12-01 MED ORDER — LEVOTHYROXINE SODIUM 75 MCG PO TABS
75.0000 ug | ORAL_TABLET | Freq: Every day | ORAL | 0 refills | Status: DC
  Filled 2022-12-01: qty 90, 90d supply, fill #0

## 2022-12-02 ENCOUNTER — Other Ambulatory Visit (HOSPITAL_COMMUNITY): Payer: Self-pay

## 2022-12-05 ENCOUNTER — Other Ambulatory Visit (HOSPITAL_COMMUNITY): Payer: Self-pay

## 2022-12-05 MED ORDER — VENLAFAXINE HCL ER 75 MG PO CP24
75.0000 mg | ORAL_CAPSULE | Freq: Every day | ORAL | 1 refills | Status: DC
  Filled 2022-12-05: qty 90, 90d supply, fill #0
  Filled 2023-03-11 (×2): qty 90, 90d supply, fill #1

## 2022-12-05 MED ORDER — LISINOPRIL 40 MG PO TABS
40.0000 mg | ORAL_TABLET | Freq: Every day | ORAL | 0 refills | Status: DC
  Filled 2022-12-05: qty 90, 90d supply, fill #0

## 2022-12-09 ENCOUNTER — Other Ambulatory Visit (HOSPITAL_COMMUNITY): Payer: Self-pay

## 2022-12-10 ENCOUNTER — Other Ambulatory Visit (HOSPITAL_COMMUNITY): Payer: Self-pay

## 2022-12-10 MED ORDER — CELECOXIB 100 MG PO CAPS
100.0000 mg | ORAL_CAPSULE | Freq: Two times a day (BID) | ORAL | 1 refills | Status: DC | PRN
  Filled 2022-12-10: qty 60, 30d supply, fill #0

## 2022-12-10 MED ORDER — CELECOXIB 200 MG PO CAPS
200.0000 mg | ORAL_CAPSULE | Freq: Two times a day (BID) | ORAL | 0 refills | Status: DC | PRN
  Filled 2022-12-10: qty 90, 45d supply, fill #0

## 2022-12-11 ENCOUNTER — Other Ambulatory Visit (HOSPITAL_COMMUNITY): Payer: Self-pay

## 2022-12-16 ENCOUNTER — Ambulatory Visit
Admission: RE | Admit: 2022-12-16 | Discharge: 2022-12-16 | Disposition: A | Discharge status: Home / Self Care | Payer: No Typology Code available for payment source | Source: Ambulatory Visit | Attending: Obstetrics and Gynecology | Admitting: Obstetrics and Gynecology

## 2022-12-16 DIAGNOSIS — Z803 Family history of malignant neoplasm of breast: Secondary | ICD-10-CM

## 2022-12-16 MED ORDER — GADOPICLENOL 0.5 MMOL/ML IV SOLN
10.0000 mL | Freq: Once | INTRAVENOUS | Status: AC | PRN
  Administered 2022-12-16: 10 mL via INTRAVENOUS

## 2022-12-18 ENCOUNTER — Other Ambulatory Visit: Payer: Self-pay | Admitting: Obstetrics and Gynecology

## 2022-12-18 DIAGNOSIS — R9389 Abnormal findings on diagnostic imaging of other specified body structures: Secondary | ICD-10-CM

## 2023-01-04 NOTE — Progress Notes (Signed)
Cardiology Clinic Note   Patient Name: Kimberly Koch Date of Encounter: 01/05/2023  Primary Care Provider:  Selinda Orion Primary Cardiologist:  Shelva Majestic, MD  Patient Profile    Kimberly Koch 57 year old female presents to the clinic today for follow-up evaluation of her abdominal aortic aneurysm and essential hypertension.  Past Medical History    Past Medical History:  Diagnosis Date   Hyperlipidemia    Hypertension    Thyroid disease    Past Surgical History:  Procedure Laterality Date   ANKLE RECONSTRUCTION Bilateral    COLONOSCOPY  2016   [facility]   NASAL SEPTUM SURGERY     x2   POLYPECTOMY     TMJ ARTHROPLASTY Bilateral     Allergies  Allergies  Allergen Reactions   Amlodipine Swelling   Demerol [Meperidine] Nausea And Vomiting   Metoprolol Succinate [Metoprolol]     Crazy dreams and night terrors   Percocet [Oxycodone-Acetaminophen] Swelling   Sulfa Antibiotics     History of Present Illness    Kimberly Koch has a PMH of HTN, HLD, hypothyroidism, AAA, and strong family history of heart disease.  Previously she was on amlodipine, ARB, metoprolol succinate and lisinopril.  Chlorthalidone was also added to her medication regimen.  Her father passed away at age 58 but was diagnosed with heart disease in his 14s.  He underwent CABG in his 63s.  Her mother had hyperlipidemia and pulmonary fibrosis due to chronic smoking.  Her maternal grandmother had heart disease and died at age 64.  Echocardiogram 11/20 showed an EF of 60-65%, mild LVH, G1 DD, trace MR, trivial TR, and trivial aortic insufficiency.  She was noted to have mildly dilated ascending aortic aneurysm at 40 mm.  She underwent coronary scoring at [facility] 5/23 which showed a total coronary calcium score of 12 which placed her in the 50th percentile and 75th percentile for age and sex matched control.  Her coronary calcium was noted in LAD and RCA territory.  She was not noted to have calcium  in her circumflex or PDA.  She was noted to have a fusiform ectasia of the ascending aorta measuring 42 mm.  She was seen in follow-up by Almyra Deforest PA-C on 07/22/2022.  During that time she presented for evaluation of her dilated ascending aorta and follow-up of her coronary calcium.  She denied exertional chest pain and worsening dyspnea.  She was able to stay active around her house without exertional symptoms.  A AAA Doppler for screening was recommended.  Her previous diagnostics were reviewed.  A plan was made for repeat CTA of her aorta with plans to repeat imaging every 6 months after her aneurysm reached 4.5 cm.  CT angio aorta chest planned for 04/15/2023.  She presents to the clinic today for follow-up evaluation and states she has been doing well from a cardiac perspective.  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  She has been experiencing some right knee pain for which she is planning to undergo a right knee scope.  She also recently had her yearly mammogram and there were 3 suspicious areas noted that she would need to have biopsies on later in this week.  She continues to be very active at work, weather permitting she is very active outside in her yard as well. Discussed the results of her AAA duplex and although it did not  suggest she had any dilatation of her aorta, she has has two other results that showed dilatation. Shared decision making,plan to repeat this study in May, which is the year mark from when the dilatation was last noted, if it is negative, then plan to repeat as indicated. She denies chest pain, palpitations, dyspnea, pnd, orthopnea, n, v, dizziness, syncope, edema, weight gain, or early satiety.    Home Medications    Prior to Admission medications   Medication Sig Start Date End Date Taking? Authorizing Provider  amitriptyline (ELAVIL) 25 MG tablet Take 1 tablet (25 mg total) by mouth at bedtime.  10/16/22     carvedilol (COREG) 12.5 MG tablet Take 1 tablet (12.5 mg total) by mouth 2 (two) times daily. 07/23/22   Troy Sine, MD  celecoxib (CELEBREX) 100 MG capsule Take 1 capsule (100 mg total) by mouth 2 (two) times daily for pain for 7 days, then take twice daily as needed. 12/10/22     celecoxib (CELEBREX) 200 MG capsule Take by mouth.    [provider]  celecoxib (CELEBREX) 200 MG capsule Take 1 capsule (200 mg total) by mouth 2 (two) times daily as needed. 12/10/22     chlorthalidone (HYGROTON) 25 MG tablet Take 0.5 tablets (12.5 mg total) by mouth as needed (for swelling). 12/17/21   Troy Sine, MD  ezetimibe (ZETIA) 10 MG tablet Take 1 tablet (10 mg total) by mouth daily. 07/23/22     levothyroxine (SYNTHROID) 50 MCG tablet Take 1 tablet (50 mcg total) by mouth daily. 05/30/22     levothyroxine (SYNTHROID) 75 MCG tablet Take 1 tablet (75 mcg total) by mouth daily. 12/01/22     lisinopril (ZESTRIL) 40 MG tablet Take 1 tablet (40 mg total) by mouth daily. 12/05/22     rosuvastatin (CRESTOR) 20 MG tablet Take 1 tablet (20 mg total) by mouth at bedtime. 07/03/22     Ruxolitinib Phosphate (OPZELURA) 1.5 % CREA Apply 1 application topically daily. 10/23/21   [name], Ronalee Red, PA-C  tirzepatide [facility]) 2.5 MG/0.5ML Pen Inject 2.5 mg into the skin once a week. 06/19/22     tirzepatide (MOUNJARO) 2.5 MG/0.5ML Pen Inject 2.5 mg into the skin once a week. 06/23/22     tirzepatide (MOUNJARO) 2.5 MG/0.5ML Pen Inject 2.5 mg into the skin once a week. 07/03/22     tiZANidine (ZANAFLEX) 4 MG tablet tizanidine 4 mg tablet  TAKE 1 TABLET BY MOUTH THREE TIMES A DAY AS NEEDED FOR SPASM    [provider]  triamcinolone cream (KENALOG) 0.1 % Apply 1 application topically daily as needed. 10/23/21   Warren Danes, PA-C  venlafaxine XR (EFFEXOR-XR) 75 MG 24 hr capsule Take 1 capsule (75 mg total) by mouth daily. 12/05/22       Family History    Family History  Problem Relation  Age of Onset   Breast cancer Maternal Grandmother    Colon polyps Father    Crohn's disease Father    Colon cancer Neg Hx    Esophageal cancer Neg Hx    Rectal cancer Neg Hx    Stomach cancer Neg Hx    She indicated that the status of her father is unknown. She indicated that the status of her maternal grandmother is unknown. She indicated that the status of her neg hx is unknown.  Social History    Social History   Socioeconomic History   Marital status: Divorced    Spouse name: Not on file  Number of children: Not on file   Years of education: Not on file   Highest education level: Not on file  Occupational History   Not on file  Tobacco Use   Smoking status: Never   Smokeless tobacco: Never  Vaping Use   Vaping Use: Never used  Substance and Sexual Activity   Alcohol use: Yes    Alcohol/week: 1.0 standard drink of alcohol    Types: 1 Cans of beer per week   Drug use: Not Currently   Sexual activity: Not on file  Other Topics Concern   Not on file  Social History Narrative   Not on file   Social Determinants of Health   Financial Resource Strain: Not on file  Food Insecurity: Not on file  Transportation Needs: Not on file  Physical Activity: Not on file  Stress: Not on file  Social Connections: Not on file  Intimate Partner Violence: Not on file     Review of Systems    General:  No chills, fever, night sweats or weight changes.  Cardiovascular:  No chest pain, dyspnea on exertion, edema, orthopnea, palpitations, paroxysmal nocturnal dyspnea. Dermatological: No rash, lesions/masses Respiratory: No cough, dyspnea Urologic: No hematuria, dysuria Abdominal:   No nausea, vomiting, diarrhea, bright red blood per rectum, melena, or hematemesis Neurologic:  No visual changes, wkns, changes in mental status. All other systems reviewed and are otherwise negative except as noted above.  Physical Exam    VS:  BP 122/84 (BP Location: Right Arm, Patient Position:  Sitting)   Pulse 64   Ht '5\' 8"'$  (1.727 m)   Wt 201 lb (91.2 kg)   BMI 30.56 kg/m  , BMI Body mass index is 30.56 kg/m. GEN: Well nourished, well developed, in no acute distress. HEENT: normal. Neck: Supple, no JVD, carotid bruits, or masses. Cardiac: RRR, no murmurs, rubs, or gallops. No clubbing, cyanosis, edema.  Radials/DP/PT 2+ and equal bilaterally.  Respiratory:  Respirations regular and unlabored, clear to auscultation bilaterally. GI: Soft, nontender, nondistended, BS + x 4. MS: no deformity or atrophy. Skin: warm and dry, no rash. Neuro:  Strength and sensation are intact. Psych: Normal affect.  Accessory Clinical Findings    Recent Labs: No results found for requested labs within last 365 days.   Recent Lipid Panel    Component Value Date/Time   CHOL 175 11/22/2019 0936   TRIG 124 11/22/2019 0936   HDL 47 11/22/2019 0936   CHOLHDL 3.7 11/22/2019 0936   LDLCALC 106 (H) 11/22/2019 0936         ECG personally reviewed by me today- NSR, HR 64 bpm, - No acute changes  Echocardiogram 10/25/2019  IMPRESSIONS     1. Left ventricular ejection fraction, by visual estimation, is 60 to  65%. The left ventricle has hyperdynamic function. There is mildly  increased left ventricular hypertrophy.   2. The average left ventricular global longitudinal strain is -19.5 %.   3. Left ventricular diastolic parameters are consistent with Grade I  diastolic dysfunction (impaired relaxation).   4. Global right ventricle has normal systolic function.The right  ventricular size is normal. No increase in right ventricular wall  thickness.   5. Left atrial size was normal.   6. Right atrial size was normal.   7. The mitral valve is abnormal. Trace mitral valve regurgitation.   8. The tricuspid valve is grossly normal. Tricuspid valve regurgitation  is trivial.   9. The aortic valve is tricuspid. Aortic valve regurgitation is  trivial.  Mild aortic valve sclerosis without stenosis.   10. The pulmonic valve was grossly normal. Pulmonic valve regurgitation is  trivial.  11. Aortic dilatation noted.  12. There is mild dilatation of the ascending aorta measuring 40 mm.  13. Normal pulmonary artery systolic pressure.  14. The inferior vena cava is normal in size with <50% respiratory  variability, suggesting right atrial pressure of 8 mmHg.   FINDINGS   Left Ventricle: Left ventricular ejection fraction, by visual estimation,  is 60 to 65%. The left ventricle has hyperdynamic function. The average  left ventricular global longitudinal strain is -19.5 %. There is mildly  increased left ventricular  hypertrophy. Left ventricular diastolic parameters are consistent with  Grade I diastolic dysfunction (impaired relaxation). Indeterminate filling  pressures.   Right Ventricle: The right ventricular size is normal. No increase in  right ventricular wall thickness. Global RV systolic function is has  normal systolic function. The tricuspid regurgitant velocity is 1.97 m/s,  and with an assumed right atrial pressure   of 8 mmHg, the estimated right ventricular systolic pressure is normal at  23.5 mmHg.   Left Atrium: Left atrial size was normal in size.   Right Atrium: Right atrial size was normal in size   Pericardium: There is no evidence of pericardial effusion.   Mitral Valve: The mitral valve is abnormal. There is mild thickening of  the mitral valve leaflet(s). Trace mitral valve regurgitation.   Tricuspid Valve: The tricuspid valve is grossly normal. Tricuspid valve  regurgitation is trivial.   Aortic Valve: The aortic valve is tricuspid. Aortic valve regurgitation is  trivial. Mild aortic valve sclerosis is present, with no evidence of  aortic valve stenosis.   Pulmonic Valve: The pulmonic valve was grossly normal. Pulmonic valve  regurgitation is trivial.   Aorta: Aortic dilatation noted. There is mild dilatation of the ascending  aorta measuring 40 mm.    Venous: The inferior vena cava is normal in size with less than 50%  respiratory variability, suggesting right atrial pressure of 8 mmHg.   IAS/Shunts: No atrial level shunt detected by color flow Doppler.     AAA duplex 08/06/2022  Summary:  Abdominal Aorta: No evidence of an abdominal aortic aneurysm was  visualized. The largest aortic measurement is 2.3 cm.  Stenosis:  Widely patent bilateral common and external iliac arteries without  evidence of stenosis.   IVC/Iliac: Patent IVC.   Assessment & Plan    1. Essential hypertension-BP today 122/84 and is well controlled. Continue current medical therapy. Increase physical activity as tolerated  2. Coronary calcium-denies chest pain.  Denies exertional symptoms.  Underwent coronary calcium scoring 5/23.  She was noted to have coronary calcium in her LAD and RCA.  Coronary calcium score was 12. Maintain physical activity. Heart healthy low-sodium high-fiber diet. Continue Coreg, rosuvastatin.    3. Hyperlipidemia-LDL 70 on 08/13/20, managed by her PCP and she states it has been checked since then, however I do not have access to review these results today. Continue rosuvastatin. Heart healthy low-sodium high-fiber diet. Increase physical activity as tolerated  4. Aneurysm of ascending aorta-denies recent episodes of chest and back discomfort.  Previous diagnostics/CT noted aortic ascending  aorta measuring 4.2 cm.  Large dissection measured 2.3 cm. Maintain good blood pressure control.  AAA duplex 08/06/2022 did not show abdominal aortic aneurysm. Plan for repeat CT angio aorta 04/15/2023.   5. Preoperative cardiovascular clearance -  According to the Revised Cardiac Risk Index (RCRI), her  Perioperative Risk of Major Cardiac Event is (%): 0.4  Her Functional Capacity in METs is: 7.99 according to the [facility] Activity Status Index (DASI).   Therefore, based on ACC/AHA guidelines, patient would be at acceptable risk for the planned  procedure without further cardiovascular testing. I will route this recommendation to the requesting party via Epic fax function.     Disposition: Follow-up with Dr. Claiborne Billings or Almyra Deforest PA-C in June/July after CT angio aorta.   Trudi Ida, NP      01/05/2023, 2:06 PM [address] Office [phone] Fax [phone]    I spent 12 minutes examining this patient, reviewing medications, and using patient centered shared decision making involving her cardiac care.  Prior to her visit I spent greater than 20 minutes reviewing her past medical history,  medications, and prior cardiac tests.

## 2023-01-05 ENCOUNTER — Ambulatory Visit: Payer: No Typology Code available for payment source | Attending: Cardiovascular Disease | Admitting: Cardiology

## 2023-01-05 ENCOUNTER — Other Ambulatory Visit (HOSPITAL_COMMUNITY): Payer: Self-pay

## 2023-01-05 ENCOUNTER — Encounter: Payer: Self-pay | Admitting: General Practice

## 2023-01-05 VITALS — BP 122/84 | HR 64 | Ht 68.0 in | Wt 201.0 lb

## 2023-01-05 DIAGNOSIS — R931 Abnormal findings on diagnostic imaging of heart and coronary circulation: Secondary | ICD-10-CM

## 2023-01-05 DIAGNOSIS — I77810 Thoracic aortic ectasia: Secondary | ICD-10-CM

## 2023-01-05 DIAGNOSIS — I10 Essential (primary) hypertension: Secondary | ICD-10-CM

## 2023-01-05 DIAGNOSIS — E782 Mixed hyperlipidemia: Secondary | ICD-10-CM

## 2023-01-05 DIAGNOSIS — Z01810 Encounter for preprocedural cardiovascular examination: Secondary | ICD-10-CM

## 2023-01-05 MED ORDER — METOPROLOL TARTRATE 50 MG PO TABS
50.0000 mg | ORAL_TABLET | Freq: Once | ORAL | 0 refills | Status: DC
  Filled 2023-01-05: qty 1, 1d supply, fill #0

## 2023-01-05 NOTE — Patient Instructions (Addendum)
Medication Instructions:  The current medical regimen is effective;  continue present plan and medications as directed. Please refer to the Current Medication list given to you today.  *If you need a refill on your cardiac medications before your next appointment, please call your pharmacy*  Lab Work: BMET AT LEAST 2 DAYS BEFORE ct If you have labs (blood work) drawn today and your tests are completely normal, you will receive your results only by: [place] (if you have MyChart) OR A paper copy in the mail If you have any lab test that is abnormal or we need to change your treatment, we will call you to review the results.  Testing/Procedures: CT ANGIO CHEST AORTA, SCHEDULE IN MAY 2024-SEE BELOW  Follow-Up: At [facility], you and your health needs are our priority.  As part of our continuing mission to provide you with exceptional heart care, we have created designated Provider Care Teams.  These Care Teams include your primary Cardiologist (physician) and Advanced Practice Providers (APPs -  Physician Assistants and Nurse Practitioners) who all work together to provide you with the care you need, when you need it.  Your next appointment:   AFTER CT ABOUT JUNE 2024   Provider:   Shelva Majestic, MD     Other Instructions  Your cardiac CT will be scheduled at one of the below locations:   [facility] [address] [place], [place] [address] [phone]  [place] [address] [place], [place] [address] [phone]  [facility] [place], [place] [address] [phone]  If scheduled at [facility], please arrive at the [facility] and Children's Entrance (Entrance C2) of [facility] 30 minutes prior to test start time. You can use the FREE valet parking offered at entrance C (encouraged to control the heart rate for the test)  Proceed  to the [facility] Radiology Department (first floor) to check-in and test prep.  All radiology patients and guests should use entrance C2 at [facility], accessed from [facility], even though the hospital's physical address listed is [address].    If scheduled at [facility] or [facility], please arrive 15 mins early for check-in and test prep.   Please follow these instructions carefully (unless otherwise directed):  Hold all erectile dysfunction medications at least 3 days (72 hrs) prior to test. (Ie viagra, cialis, sildenafil, tadalafil, etc) We will administer nitroglycerin during this exam.   On the Night Before the Test: Be sure to Drink plenty of water. Do not consume any caffeinated/decaffeinated beverages or chocolate 12 hours prior to your test. Do not take any antihistamines 12 hours prior to your test.  On the Day of the Test: Drink plenty of water until 1 hour prior to the test. Do not eat any food 1 hour prior to test. You may take your regular medications prior to the test.  Take metoprolol (Lopressor) tartrate 50 mg  two hours prior to test. HOLD Furosemide/Hydrochlorothiazide morning of the test. FEMALES- please wear underwire-free bra if available, avoid dresses & tight clothing      After the Test: Drink plenty of water. After receiving IV contrast, you may experience a mild flushed feeling. This is normal. On occasion, you may experience a mild rash up to 24 hours after the test. This is not dangerous. If this occurs, you  can take Benadryl 25 mg and increase your fluid intake. If you experience trouble breathing, this can be serious. If it is severe call 911 IMMEDIATELY. If it is mild, please call our office. If you take any of these medications: Glipizide/Metformin, Avandament, Glucavance, please do not take 48 hours after completing test unless otherwise instructed.  We will call  to schedule your test 2-4 weeks out understanding that some insurance companies will need an authorization prior to the service being performed.   For non-scheduling related questions, please contact the cardiac imaging nurse navigator should you have any questions/concerns: Marchia Bond, Cardiac Imaging Nurse Navigator Gordy Clement, Cardiac Imaging Nurse Navigator [facility] Direct Office Dial: [phone]   For scheduling needs, including cancellations and rescheduling, please call [place], [phone].

## 2023-01-07 ENCOUNTER — Ambulatory Visit
Admission: RE | Admit: 2023-01-07 | Discharge: 2023-01-07 | Disposition: A | Discharge status: Home / Self Care | Payer: No Typology Code available for payment source | Source: Ambulatory Visit | Attending: Obstetrics and Gynecology | Admitting: Obstetrics and Gynecology

## 2023-01-07 DIAGNOSIS — R9389 Abnormal findings on diagnostic imaging of other specified body structures: Secondary | ICD-10-CM

## 2023-01-11 ENCOUNTER — Other Ambulatory Visit (HOSPITAL_COMMUNITY): Payer: Self-pay

## 2023-01-12 ENCOUNTER — Other Ambulatory Visit (HOSPITAL_COMMUNITY): Payer: Self-pay

## 2023-01-12 MED ORDER — EZETIMIBE 10 MG PO TABS
10.0000 mg | ORAL_TABLET | Freq: Every day | ORAL | 1 refills | Status: DC
  Filled 2023-01-12: qty 90, 90d supply, fill #0
  Filled 2023-04-14: qty 90, 90d supply, fill #1

## 2023-01-12 MED ORDER — ROSUVASTATIN CALCIUM 20 MG PO TABS
20.0000 mg | ORAL_TABLET | Freq: Every day | ORAL | 1 refills | Status: DC
  Filled 2023-01-12: qty 90, 90d supply, fill #0
  Filled 2023-04-14: qty 90, 90d supply, fill #1

## 2023-01-13 ENCOUNTER — Other Ambulatory Visit (HOSPITAL_COMMUNITY): Payer: Self-pay

## 2023-01-15 ENCOUNTER — Other Ambulatory Visit (HOSPITAL_COMMUNITY): Payer: Self-pay

## 2023-01-15 MED ORDER — ASPIRIN 81 MG PO CHEW
81.0000 mg | CHEWABLE_TABLET | Freq: Two times a day (BID) | ORAL | 0 refills | Status: DC
  Filled 2023-01-15: qty 84, 42d supply, fill #0

## 2023-01-15 MED ORDER — ONDANSETRON HCL 4 MG PO TABS
ORAL_TABLET | ORAL | 0 refills | Status: AC
  Filled 2023-01-15: qty 10, 3d supply, fill #0

## 2023-01-15 MED ORDER — HYDROMORPHONE HCL 2 MG PO TABS
1.0000 mg | ORAL_TABLET | Freq: Four times a day (QID) | ORAL | 0 refills | Status: DC
  Filled 2023-01-15: qty 12, 6d supply, fill #0

## 2023-01-15 MED ORDER — ACETAMINOPHEN EXTRA STRENGTH 500 MG PO CAPS: 2.0000 | ORAL_CAPSULE | Freq: Three times a day (TID) | ORAL | 0 refills | Status: DC

## 2023-01-16 ENCOUNTER — Other Ambulatory Visit (HOSPITAL_COMMUNITY): Payer: Self-pay

## 2023-01-20 ENCOUNTER — Ambulatory Visit: Payer: No Typology Code available for payment source | Admitting: Cardiovascular Disease

## 2023-02-10 ENCOUNTER — Other Ambulatory Visit (HOSPITAL_COMMUNITY): Payer: Self-pay

## 2023-02-10 MED ORDER — AMOXICILLIN-POT CLAVULANATE 875-125 MG PO TABS
1.0000 | ORAL_TABLET | Freq: Two times a day (BID) | ORAL | 0 refills | Status: DC
  Filled 2023-02-10: qty 14, 7d supply, fill #0

## 2023-03-06 ENCOUNTER — Other Ambulatory Visit (HOSPITAL_COMMUNITY): Payer: Self-pay

## 2023-03-11 ENCOUNTER — Other Ambulatory Visit (HOSPITAL_COMMUNITY): Payer: Self-pay

## 2023-03-11 MED ORDER — LISINOPRIL 40 MG PO TABS
40.0000 mg | ORAL_TABLET | Freq: Every day | ORAL | 0 refills | Status: DC
  Filled 2023-03-11: qty 90, 90d supply, fill #0

## 2023-03-11 MED ORDER — LEVOTHYROXINE SODIUM 75 MCG PO TABS
75.0000 ug | ORAL_TABLET | Freq: Every day | ORAL | 0 refills | Status: DC
  Filled 2023-03-11: qty 90, 90d supply, fill #0

## 2023-03-11 MED ORDER — CELECOXIB 200 MG PO CAPS
200.0000 mg | ORAL_CAPSULE | Freq: Two times a day (BID) | ORAL | 0 refills | Status: DC | PRN
  Filled 2023-03-11: qty 90, 45d supply, fill #0

## 2023-03-31 ENCOUNTER — Other Ambulatory Visit: Payer: No Typology Code available for payment source

## 2023-04-14 ENCOUNTER — Other Ambulatory Visit (HOSPITAL_COMMUNITY): Payer: Self-pay

## 2023-04-15 ENCOUNTER — Ambulatory Visit (HOSPITAL_COMMUNITY)
Admission: RE | Admit: 2023-04-15 | Discharge: 2023-04-15 | Disposition: A | Discharge status: Home / Self Care | Payer: No Typology Code available for payment source | Source: Ambulatory Visit | Attending: Physician Assistant | Admitting: Physician Assistant

## 2023-04-15 ENCOUNTER — Other Ambulatory Visit (HOSPITAL_COMMUNITY): Payer: Self-pay

## 2023-04-15 DIAGNOSIS — I7121 Aneurysm of the ascending aorta, without rupture: Secondary | ICD-10-CM

## 2023-04-15 MED ORDER — AMITRIPTYLINE HCL 25 MG PO TABS
25.0000 mg | ORAL_TABLET | Freq: Every day | ORAL | 1 refills | Status: DC
  Filled 2023-04-15: qty 90, 90d supply, fill #0
  Filled 2023-07-26: qty 90, 90d supply, fill #1

## 2023-04-15 MED ORDER — IOHEXOL 350 MG/ML SOLN
75.0000 mL | Freq: Once | INTRAVENOUS | Status: AC | PRN
  Administered 2023-04-15: 75 mL via INTRAVENOUS

## 2023-05-24 ENCOUNTER — Other Ambulatory Visit (HOSPITAL_COMMUNITY): Payer: Self-pay

## 2023-05-25 ENCOUNTER — Other Ambulatory Visit (HOSPITAL_COMMUNITY): Payer: Self-pay

## 2023-05-25 MED ORDER — LISINOPRIL 40 MG PO TABS
40.0000 mg | ORAL_TABLET | Freq: Every day | ORAL | 0 refills | Status: DC
  Filled 2023-05-25: qty 90, 90d supply, fill #0

## 2023-05-25 MED ORDER — LEVOTHYROXINE SODIUM 75 MCG PO TABS
75.0000 ug | ORAL_TABLET | Freq: Every day | ORAL | 0 refills | Status: DC
  Filled 2023-05-25: qty 90, 90d supply, fill #0

## 2023-05-25 MED ORDER — VENLAFAXINE HCL ER 75 MG PO CP24
75.0000 mg | ORAL_CAPSULE | Freq: Every day | ORAL | 1 refills | Status: DC
  Filled 2023-05-25: qty 90, 90d supply, fill #0
  Filled 2023-09-03: qty 90, 90d supply, fill #1

## 2023-07-26 ENCOUNTER — Other Ambulatory Visit: Payer: Self-pay | Admitting: Cardiovascular Disease

## 2023-07-26 ENCOUNTER — Other Ambulatory Visit (HOSPITAL_COMMUNITY): Payer: Self-pay

## 2023-07-27 ENCOUNTER — Other Ambulatory Visit (HOSPITAL_COMMUNITY): Payer: Self-pay

## 2023-07-27 MED ORDER — CARVEDILOL 12.5 MG PO TABS
12.5000 mg | ORAL_TABLET | Freq: Two times a day (BID) | ORAL | 3 refills | Status: DC
  Filled 2023-07-27: qty 180, 90d supply, fill #0
  Filled 2023-10-23: qty 180, 90d supply, fill #1
  Filled 2024-03-02: qty 180, 90d supply, fill #2
  Filled 2024-06-01: qty 180, 90d supply, fill #3

## 2023-07-27 MED ORDER — EZETIMIBE 10 MG PO TABS
10.0000 mg | ORAL_TABLET | Freq: Every day | ORAL | 1 refills | Status: DC
  Filled 2023-07-27: qty 90, 90d supply, fill #0
  Filled 2023-10-23: qty 90, 90d supply, fill #1

## 2023-07-27 MED ORDER — ROSUVASTATIN CALCIUM 20 MG PO TABS
20.0000 mg | ORAL_TABLET | Freq: Every day | ORAL | 1 refills | Status: DC
  Filled 2023-07-27: qty 90, 90d supply, fill #0
  Filled 2023-10-23: qty 90, 90d supply, fill #1

## 2023-07-29 ENCOUNTER — Other Ambulatory Visit (HOSPITAL_COMMUNITY): Payer: Self-pay

## 2023-07-29 NOTE — Progress Notes (Signed)
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  ENT surgery- septoplasty x 2. Denies lung disease. Mother uses CPAP. Elavil at bedtime helps sleep. 1 AM coffee.  Prior to Admission medications   Medication Sig Start Date End Date Taking? Authorizing Provider  Acetaminophen Extra Strength 500 MG CAPS Take 2 capsules by mouth every 8 (eight) hours for pain. 01/15/23  Yes   amitriptyline (ELAVIL) 25 MG tablet Take 1 tablet (25 mg total) by mouth at bedtime. 04/15/23  Yes   aspirin 81 MG chewable tablet Chew 1 tablet (81 mg total) by mouth 2 (two) times daily for 6 weeks post op for DVT prophylaxis. 01/15/23  Yes   carvedilol (COREG) 12.5 MG tablet Take 1 tablet (12.5 mg total) by mouth 2 (two) times daily. 07/27/23  Yes [name], NP  celecoxib (CELEBREX) 200 MG capsule Take 1 capsule (200 mg total) by mouth 2 (two) times daily as needed. Patient taking differently: Take 200 mg by mouth daily. 03/11/23  Yes   chlorthalidone (HYGROTON) 25 MG tablet Take 0.5 tablets (12.5 mg total) by mouth as needed (for swelling). 12/17/21  Yes [name], MD  ezetimibe (ZETIA) 10 MG tablet Take 1 tablet (10 mg total) by mouth daily. 07/27/23  Yes   HYDROmorphone (DILAUDID) 2 MG tablet Take 0.5 tablets (1 mg total) by mouth every 6 (six) hours to 8 (eight) hours as needed for severe pain only after surgery. 01/15/23  Yes   levothyroxine (SYNTHROID) 75 MCG tablet Take 1 tablet (75 mcg total) by mouth daily. 05/25/23  Yes   lisinopril (ZESTRIL) 40 MG tablet Take 1 tablet (40 mg total) by mouth daily. 05/25/23  Yes   ondansetron (ZOFRAN) 4 MG tablet Take 1 tablet by mouth every six hours as needed for nausea / vomiting 01/15/23  Yes    rosuvastatin (CRESTOR) 20 MG tablet Take 1 tablet (20 mg total) by mouth at bedtime. 07/27/23  Yes   Ruxolitinib Phosphate (OPZELURA) 1.5 % CREA Apply 1 application topically daily. 10/23/21  Yes [name], [name], PA-C  tiZANidine (ZANAFLEX) 4 MG tablet tizanidine 4 mg tablet  TAKE 1 TABLET BY MOUTH THREE TIMES A DAY AS NEEDED FOR SPASM   Yes [provider]  triamcinolone cream (KENALOG) 0.1 % Apply 1 application topically daily as needed. 10/23/21  Yes [name], [name], PA-C  venlafaxine XR (EFFEXOR-XR) 75 MG 24 hr capsule Take 1 capsule (75 mg total) by mouth daily. 05/25/23  Yes   amoxicillin-clavulanate (AUGMENTIN) 875-125 MG tablet Take 1 tablet by mouth 2 (two) times daily. 02/10/23     celecoxib (CELEBREX) 100 MG capsule Take 1 capsule (100 mg total) by mouth 2 (two) times daily for pain for 7 days, then take twice daily as needed. 12/10/22     celecoxib (CELEBREX) 50 MG capsule Take by mouth.    [provider]  levothyroxine (SYNTHROID) 50 MCG tablet Take 1 tablet (50 mcg total) by mouth daily. 05/30/22     lisinopril (ZESTRIL) 40 MG tablet Take 1 tablet (40 mg total) by mouth daily. 12/05/22     tirzepatide (MOUNJARO) 2.5 MG/0.5ML Pen Inject 2.5 mg into the skin once a week. 06/19/22     tirzepatide (MOUNJARO) 2.5 MG/0.5ML Pen Inject 2.5  mg into the skin once a week. 06/23/22     tirzepatide (MOUNJARO) 2.5 MG/0.5ML Pen Inject 2.5 mg into the skin once a week. 07/03/22     metoprolol tartrate (LOPRESSOR) 50 MG tablet Take 1 tablet (50 mg total) by mouth once for 1 dose. Take two hours prior to test 01/05/23 03/11/23  [name], NP   Past Medical History:  Diagnosis Date   Hyperlipidemia    Hypertension    Thyroid disease    Past Surgical History:  Procedure Laterality Date   ANKLE RECONSTRUCTION Bilateral    COLONOSCOPY  2016   Noe Gens   NASAL SEPTUM SURGERY     x2   POLYPECTOMY     TMJ ARTHROPLASTY Bilateral    Family History  Problem Relation Age of  Onset   Breast cancer Maternal Grandmother    Colon polyps Father    Crohn's disease Father    Colon cancer Neg Hx    Esophageal cancer Neg Hx    Rectal cancer Neg Hx    Stomach cancer Neg Hx    Social History   Socioeconomic History   Marital status: Divorced    Spouse name: Not on file   Number of children: Not on file   Years of education: Not on file   Highest education level: Not on file  Occupational History   Not on file  Tobacco Use   Smoking status: Never   Smokeless tobacco: Never  Vaping Use   Vaping status: Never Used  Substance and Sexual Activity   Alcohol use: Yes    Alcohol/week: 1.0 standard drink of alcohol    Types: 1 Cans of beer per week   Drug use: Not Currently   Sexual activity: Not on file  Other Topics Concern   Not on file  Social History Narrative   Not on file   Social Determinants of Health   Financial Resource Strain: Low Risk  (11/25/2022)   Received from [facility], [facility]   Overall Financial Resource Strain (CARDIA)    Difficulty of Paying Living Expenses: Not very hard  Food Insecurity: Patient Declined (11/25/2022)   Received from [facility], [facility]   Hunger Vital Sign    Worried About Running Out of Food in the Last Year: Patient declined    Ran Out of Food in the Last Year: Patient declined  Transportation Needs: No Transportation Needs (07/15/2021)   Received from [employer], [facility]   PRAPARE - Transportation    Lack of Transportation (Medical): No    Lack of Transportation (Non-Medical): No  Physical Activity: Insufficiently Active (11/25/2022)   Received from [facility], [facility]   Exercise Vital Sign    Days of Exercise per Week: 3 days    Minutes of Exercise per Session: 30 min  Stress: No Stress Concern Present (11/25/2022)   Received from [facility], [facility] - Occupational Stress Questionnaire    Feeling of Stress : Only a  little  Social Connections: Socially Integrated (11/25/2022)   Received from [facility], [facility]   Social Network    How would you rate your social network (family, work, friends)?: Good participation with social networks  Intimate Partner Violence: Not At Risk (11/25/2022)   Received from [facility], [facility]   HITS    Over the last 12 months how often did your partner physically hurt you?: 1    Over the last 12 months  how often did your partner insult you or talk down to you?: 1    Over the last 12 months how often did your partner threaten you with physical harm?: 1    Over the last 12 months how often did your partner scream or curse at you?: 1   ROS-see HPI   + = positive Constitutional:    weight loss, night sweats, fevers, chills, fatigue, lassitude. HEENT:    headaches, difficulty swallowing, tooth/dental problems, sore throat,       sneezing, itching, ear ache, nasal congestion, post nasal drip, snoring CV:    chest pain, orthopnea, PND, swelling in lower extremities, anasarca, dizziness, palpitations Resp:   shortness of breath with exertion or at rest.                productive cough,   non-productive cough, coughing up of blood.              change in color of mucus.  wheezing.   Skin:    rash or lesions. GI:  No-   heartburn, indigestion, abdominal pain, nausea, vomiting, diarrhea,                 change in bowel habits, loss of appetite GU: dysuria, change in color of urine, no urgency or frequency.   flank pain. MS:   joint pain, stiffness, decreased range of motion, back pain. Neuro-     nothing unusual Psych:  change in mood or affect.  depression or anxiety.   memory loss.   OBJ- Physical Exam General- Alert, Oriented, Affect-appropriate, Distress- none acute Skin- rash-none, lesions- none, excoriation- none Lymphadenopathy- none Head- atraumatic            Eyes- Gross vision intact, PERRLA, conjunctivae and secretions clear            Ears-  Hearing, canals-normal            Nose- Clear, no-Septal dev, mucus, polyps, erosion, perforation             Throat- Mallampati III , mucosa clear , drainage- none, tonsils- atrophic, +teeth Neck- flexible , trachea midline, no stridor , thyroid nl, carotid no bruit Chest - symmetrical excursion , unlabored           Heart/CV- RRR , no murmur , no gallop  , no rub, nl s1 s2                           - JVD- none , edema- none, stasis changes- none, varices- none           Lung- clear to P&A, wheeze- none, cough- none , dullness-none, rub- none           Chest wall-  Abd-  Br/ Gen/ Rectal- Not done, not indicated Extrem- cyanosis- none, clubbing, none, atrophy- none, strength- nl Neuro- grossly intact to observation

## 2023-07-30 ENCOUNTER — Encounter: Payer: Self-pay | Admitting: Internal Medicine

## 2023-07-30 ENCOUNTER — Ambulatory Visit: Payer: No Typology Code available for payment source | Admitting: Internal Medicine

## 2023-07-30 VITALS — BP 128/86 | HR 61 | Ht 68.0 in | Wt 202.2 lb

## 2023-07-30 DIAGNOSIS — J40 Bronchitis, not specified as acute or chronic: Secondary | ICD-10-CM

## 2023-07-30 DIAGNOSIS — R0683 Snoring: Secondary | ICD-10-CM | POA: Diagnosis not present

## 2023-07-30 NOTE — Patient Instructions (Signed)
Order- schedule home sleep test   dx snoring  Please call us about 2 weeks after our sleep test for results and recommendations  We mentioned trying Biotene at bedtime for dry mouth

## 2023-08-03 ENCOUNTER — Encounter: Payer: Self-pay | Admitting: Internal Medicine

## 2023-08-03 DIAGNOSIS — G4733 Obstructive sleep apnea (adult) (pediatric): Secondary | ICD-10-CM | POA: Insufficient documentation

## 2023-08-03 DIAGNOSIS — R0683 Snoring: Secondary | ICD-10-CM | POA: Insufficient documentation

## 2023-08-03 NOTE — Assessment & Plan Note (Signed)
She didn't indicate significant history and exam unremarkable at this visit.

## 2023-08-03 NOTE — Assessment & Plan Note (Signed)
Probable sleep apnea. Appropriate discussion done. Plan- sleep study.

## 2023-08-26 ENCOUNTER — Institutional Professional Consult (permissible substitution): Payer: No Typology Code available for payment source | Admitting: Adult Health

## 2023-08-26 ENCOUNTER — Other Ambulatory Visit (HOSPITAL_COMMUNITY): Payer: Self-pay

## 2023-08-26 MED ORDER — MOUNJARO 2.5 MG/0.5ML ~~LOC~~ SOAJ
2.5000 mg | SUBCUTANEOUS | 0 refills | Status: DC
  Filled 2023-08-26 – 2023-09-04 (×2): qty 2, 28d supply, fill #0

## 2023-09-01 ENCOUNTER — Other Ambulatory Visit (HOSPITAL_COMMUNITY): Payer: Self-pay

## 2023-09-03 ENCOUNTER — Other Ambulatory Visit (HOSPITAL_COMMUNITY): Payer: Self-pay

## 2023-09-03 MED ORDER — LISINOPRIL 40 MG PO TABS
40.0000 mg | ORAL_TABLET | Freq: Every day | ORAL | 0 refills | Status: DC
  Filled 2023-09-03: qty 90, 90d supply, fill #0

## 2023-09-03 MED ORDER — LEVOTHYROXINE SODIUM 75 MCG PO TABS
75.0000 ug | ORAL_TABLET | Freq: Every day | ORAL | 0 refills | Status: DC
  Filled 2023-09-03: qty 90, 90d supply, fill #0

## 2023-09-04 ENCOUNTER — Other Ambulatory Visit (HOSPITAL_COMMUNITY): Payer: Self-pay

## 2023-09-18 DIAGNOSIS — R0683 Snoring: Secondary | ICD-10-CM

## 2023-09-18 DIAGNOSIS — G4733 Obstructive sleep apnea (adult) (pediatric): Secondary | ICD-10-CM | POA: Diagnosis not present

## 2023-09-25 ENCOUNTER — Other Ambulatory Visit (HOSPITAL_COMMUNITY): Payer: Self-pay

## 2023-09-25 MED ORDER — METFORMIN HCL ER 500 MG PO TB24
500.0000 mg | ORAL_TABLET | Freq: Every day | ORAL | 0 refills | Status: DC
  Filled 2023-09-25: qty 30, 30d supply, fill #0

## 2023-10-23 ENCOUNTER — Other Ambulatory Visit (HOSPITAL_COMMUNITY): Payer: Self-pay

## 2023-10-26 ENCOUNTER — Other Ambulatory Visit (HOSPITAL_COMMUNITY): Payer: Self-pay

## 2023-10-26 MED ORDER — AMITRIPTYLINE HCL 25 MG PO TABS
25.0000 mg | ORAL_TABLET | Freq: Every day | ORAL | 1 refills | Status: DC
  Filled 2023-10-26: qty 90, 90d supply, fill #0
  Filled 2024-01-24: qty 90, 90d supply, fill #1

## 2023-11-03 ENCOUNTER — Encounter: Payer: Self-pay | Admitting: Internal Medicine

## 2023-11-03 ENCOUNTER — Ambulatory Visit: Payer: No Typology Code available for payment source | Admitting: Internal Medicine

## 2023-11-03 VITALS — BP 123/78 | HR 75 | Ht 68.0 in | Wt 199.8 lb

## 2023-11-03 DIAGNOSIS — G4733 Obstructive sleep apnea (adult) (pediatric): Secondary | ICD-10-CM | POA: Diagnosis not present

## 2023-11-03 DIAGNOSIS — J40 Bronchitis, not specified as acute or chronic: Secondary | ICD-10-CM

## 2023-11-03 NOTE — Progress Notes (Unsigned)
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  ENT surgery- septoplasty x 2. Denies lung disease. Mother uses CPAP. Elavil at bedtime helps sleep. 1 AM coffee.  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 For treatment decision    ROS-see HPI   + = positive Constitutional:    weight loss, night sweats, fevers, chills, fatigue, lassitude. HEENT:    headaches, difficulty swallowing, tooth/dental problems, sore throat,       sneezing, itching, ear ache, nasal congestion, post nasal drip, snoring CV:    chest pain, orthopnea, PND, swelling in lower extremities, anasarca, dizziness, palpitations Resp:   shortness of breath with exertion or at rest.                productive cough,   non-productive cough, coughing up of blood.              change in color of mucus.  wheezing.   Skin:    rash or lesions. GI:  No-   heartburn, indigestion, abdominal pain, nausea, vomiting, diarrhea,                 change in bowel habits, loss of appetite GU: dysuria, change in color of urine, no urgency or frequency.   flank pain. MS:   joint pain, stiffness, decreased range of motion, back pain. Neuro-     nothing unusual Psych:  change in mood or affect.  depression or anxiety.   memory loss.   OBJ- Physical Exam General- Alert, Oriented, Affect-appropriate, Distress- none acute Skin- rash-none, lesions- none, excoriation- none Lymphadenopathy- none Head- atraumatic            Eyes- Gross vision intact, PERRLA, conjunctivae and  secretions clear            Ears- Hearing, canals-normal            Nose- Clear, no-Septal dev, mucus, polyps, erosion, perforation             Throat- Mallampati III , mucosa clear , drainage- none, tonsils- atrophic, +teeth Neck- flexible , trachea midline, no stridor , thyroid nl, carotid no bruit Chest - symmetrical excursion , unlabored           Heart/CV- RRR , no murmur , no gallop  , no rub, nl s1 s2                           - JVD- none , edema- none, stasis changes- none, varices- none           Lung- clear to P&A, wheeze- none, cough- none , dullness-none, rub- none           Chest wall-  Abd-  Br/ Gen/ Rectal- Not done, not indicated Extrem- cyanosis- none, clubbing, none, atrophy- none, strength- nl Neuro- grossly intact to observation

## 2023-11-03 NOTE — Patient Instructions (Signed)
Order- referral to Dr Althea Grimmer, Orthodontist    consider oral appliance for OSA  Please call if we can help

## 2023-11-04 ENCOUNTER — Other Ambulatory Visit (HOSPITAL_COMMUNITY): Payer: Self-pay

## 2023-11-04 MED ORDER — MOUNJARO 2.5 MG/0.5ML ~~LOC~~ SOAJ
2.5000 mg | SUBCUTANEOUS | 0 refills | Status: DC
  Filled 2023-11-04 – 2023-11-25 (×3): qty 2, 28d supply, fill #0

## 2023-11-11 ENCOUNTER — Other Ambulatory Visit (HOSPITAL_COMMUNITY): Payer: Self-pay

## 2023-11-18 ENCOUNTER — Encounter: Payer: Self-pay | Admitting: Physician Assistant

## 2023-11-18 ENCOUNTER — Ambulatory Visit: Payer: No Typology Code available for payment source | Attending: Physician Assistant | Admitting: Emergency Medicine

## 2023-11-18 ENCOUNTER — Other Ambulatory Visit (HOSPITAL_COMMUNITY): Payer: Self-pay

## 2023-11-18 VITALS — BP 112/84 | HR 60 | Ht 68.0 in | Wt 201.2 lb

## 2023-11-18 DIAGNOSIS — E785 Hyperlipidemia, unspecified: Secondary | ICD-10-CM | POA: Diagnosis not present

## 2023-11-18 DIAGNOSIS — R931 Abnormal findings on diagnostic imaging of heart and coronary circulation: Secondary | ICD-10-CM

## 2023-11-18 DIAGNOSIS — I7121 Aneurysm of the ascending aorta, without rupture: Secondary | ICD-10-CM | POA: Diagnosis not present

## 2023-11-18 DIAGNOSIS — I10 Essential (primary) hypertension: Secondary | ICD-10-CM | POA: Diagnosis not present

## 2023-11-18 DIAGNOSIS — E119 Type 2 diabetes mellitus without complications: Secondary | ICD-10-CM

## 2023-11-18 DIAGNOSIS — G4733 Obstructive sleep apnea (adult) (pediatric): Secondary | ICD-10-CM

## 2023-11-18 NOTE — Progress Notes (Signed)
Cardiology Office Note:    Date:  11/18/2023  ID:  Kimberly Koch, DOB 1965/12/31, MRN [id number] PCP: Dani Gobble, PA-C  [facility] Providers Cardiologist:  Nicki Guadalajara, MD       Patient Profile:      Kimberly Koch is a 57 year old female with past medical history of aneurysm of ascending aorta, hyperlipidemia, hypothyroidism, coronary artery calcification, hypertension.  She was initially seen by cardiology on 10/11/2023 hypertension.  In the past she has been on numerous medications for hypertension including amlodipine, ARB, lisinopril, metoprolol succinate, and chlorthalidone.  She has significant family history of heart disease.  Echocardiogram 10/2019 showed an EF of 60 to 65%, mild LVH, grade 1 DD, trace MR, trace TR and trivial aortic insufficiency.  She was noted to have mildly dilated ascending aortic aneurysm of 40 mm.  She was seen by Dr. Tresa Endo on 04/13/2020 where she had episodes of hypotension and lightheadedness and her chlorthalidone was decreased to 12.5 mg twice weekly and lisinopril 40 mg in addition to metoprolol succinate 100 mg was continued.  She was eventually taken off of metoprolol succinate due to night terrors and switched to carvedilol.  On 12/17/2021 she was noted to be hypotensive and her chlorthalidone 12.5 mg twice weekly was discontinued and changed to as needed.  She underwent coronary calcium scoring at [facility] on 04/2022 which showed total coronary calcium score of 12 which places her in the 70th percentile for age and sex matched control.  Her coronary calcium was noted in LAD and RCA.  She was noted to have fusiform ectasia of the ascending aorta measuring 42 mm.  She was seen by Lisabeth Devoid, PA on 07/22/2022.  AAA vascular ultrasound was ordered and performed showing no evidence of abdominal aortic aneurysm.  At this time plan was made for repeat CTA of her aorta with plans to repeat imaging every 6 months after aneurysm reach 4.5 cm. CT angio chest  aorta completed on 04/15/2023 showing 4.1 cm fusiform ectasia of the ascending aorta.       History of Present Illness:   Kimberly Koch is a 57 y.o. female who returns for her 1 year follow-up.  Today she is doing well overall.  She has had no cardiovascular complaints or concerns over the past year.  She notes that she walks around 3 to 4 miles at work daily.  She also walks her dog at least 1 mile daily.  She looks to improve her diet over this next year.  She notes that her biggest stressor is her job.  She notes that the stress does have a negative impact on her health.  She is very aware of her health as she has a strong family history of cardiovascular disease.  She denies any chest pain, shortness of breath, DOE, PND, orthopnea, syncope, near syncope, leg swelling.       Review of Systems  Constitutional: Negative for weight gain and weight loss.  Cardiovascular:  Negative for chest pain, claudication, cyanosis, dyspnea on exertion, irregular heartbeat, leg swelling, near-syncope, orthopnea, palpitations, paroxysmal nocturnal dyspnea and syncope.  Respiratory:  Positive for snoring. Negative for cough, hemoptysis, shortness of breath and sleep disturbances due to breathing.   Gastrointestinal:  Negative for abdominal pain, hematochezia and melena.  Genitourinary:  Negative for hematuria.  Neurological:  Negative for dizziness, light-headedness and weakness.     See HPI    Studies Reviewed:   EKG Interpretation Date/Time:  Wednesday November 18 2023 15:11:39 EST Ventricular  Rate:  60 PR Interval:  134 QRS Duration:  110 QT Interval:  450 QTC Calculation: 450 R Axis:   20  Text Interpretation: Normal sinus rhythm Normal ECG Confirmed by Rise Paganini [phone]) on 11/18/2023 7:05:58 PM    CT angio chest aorta 04/15/2023 IMPRESSION: 4.1 cm fusiform ectasia of the ascending thoracic aorta. Continue annual imaging followup by CTA or MRA.  VAS [place] AAA 08/06/2022 Abdominal Aorta: No  evidence of an abdominal aortic aneurysm was  visualized. The largest aortic measurement is 2.3 cm.  Stenosis:  Widely patent bilateral common and external iliac arteries without  evidence of stenosis.   Echocardiogram 10/25/2019 1. Left ventricular ejection fraction, by visual estimation, is 60 to  65%. The left ventricle has hyperdynamic function. There is mildly  increased left ventricular hypertrophy.   2. The average left ventricular global longitudinal strain is -19.5 %.   3. Left ventricular diastolic parameters are consistent with Grade I  diastolic dysfunction (impaired relaxation).   4. Global right ventricle has normal systolic function.The right  ventricular size is normal. No increase in right ventricular wall  thickness.   5. Left atrial size was normal.   6. Right atrial size was normal.   7. The mitral valve is abnormal. Trace mitral valve regurgitation.   8. The tricuspid valve is grossly normal. Tricuspid valve regurgitation  is trivial.   9. The aortic valve is tricuspid. Aortic valve regurgitation is trivial.  Mild aortic valve sclerosis without stenosis.  10. The pulmonic valve was grossly normal. Pulmonic valve regurgitation is  trivial.  11. Aortic dilatation noted.  12. There is mild dilatation of the ascending aorta measuring 40 mm.  13. Normal pulmonary artery systolic pressure.  14. The inferior vena cava is normal in size with <50% respiratory  variability, suggesting right atrial pressure of 8 mmHg.   Risk Assessment/Calculations:             Physical Exam:   VS:  BP 112/84 (BP Location: Left Arm, Patient Position: Sitting, Cuff Size: Normal)   Pulse 60   Ht 5\' 8"  (1.727 m)   Wt 201 lb 3.2 oz (91.3 kg)   SpO2 94%   BMI 30.59 kg/m    Wt Readings from Last 3 Encounters:  11/18/23 201 lb 3.2 oz (91.3 kg)  11/03/23 199 lb 12.8 oz (90.6 kg)  07/30/23 202 lb 3.2 oz (91.7 kg)    Constitutional:      Appearance: Normal and healthy appearance.   HENT:     Head: Normocephalic.  Neck:     Vascular: JVD normal.  Pulmonary:     Effort: Pulmonary effort is normal.     Breath sounds: Normal breath sounds.  Chest:     Chest wall: Not tender to palpatation.  Cardiovascular:     PMI at left midclavicular line. Normal rate. Regular rhythm. Normal S1. Normal S2.      Murmurs: There is no murmur.     No gallop.  No click. No rub.  Pulses:    Intact distal pulses.  Edema:    Peripheral edema absent.  Musculoskeletal: Normal range of motion.     Cervical back: Normal range of motion and neck supple. Skin:    General: Skin is warm and dry.  Neurological:     General: No focal deficit present.     Mental Status: Alert, oriented to person, place, and time and oriented to person, place and time.  Psychiatric:  Attention and Perception: Attention and perception normal.        Mood and Affect: Mood normal.        Behavior: Behavior is cooperative.        Thought Content: Thought content normal.        Assessment and Plan:  Ascending thoracic aortic aneurysm -CT angio chest aorta on 04/25/2023 showing 4.1cm fusiform ectasia of the ascending thoracic aorta -No chest pain, back pain, dyspnea -Repeat CTA chest/aorta for 04/2024, order placed today -Maintain good blood pressure control -Avoid fluoroquinolones   Hypertension BP today 112/84, well-controlled -Continue carvedilol 12.5 mg twice daily, lisinopril 40 mg daily, chlorthalidone 25 mg as needed -Encouraged 150 minutes of moderate intensity exercise weekly  Hyperlipidemia, goal <70 -LDL 61 on 08/20/2023 -Under excellent control -Continue rosuvastatin 20 mg, ezetimibe 10 mg  Coronary artery calcification -04/2022 coronary calcium in LAD and RCA, scoring of 12 -Stable with no anginal symptoms, no indication for ischemic evaluation -Encouraged heart healthy, low-sodium, high-fiber diet -Continue cholesterol-lowering therapy  OSA -Followed by pulmonology -Plans to see  orthodontist for oral appliance   T2DM -A1c 6.5 on 08/20/2023, followed by PCP -Waiting for approval for [facility] -Continue metformin 500 mg daily                 Dispo:  Return in about 1 year (around 11/17/2024).  Signed, Denyce Robert, NP

## 2023-11-18 NOTE — Patient Instructions (Addendum)
Medication Instructions:  The current medical regimen is effective;  continue present plan and medications as directed. Please refer to the Current Medication list given to you today.  *If you need a refill on your cardiac medications before your next appointment, please call your pharmacy*  Lab Work: NONE If you have labs (blood work) drawn today and your tests are completely normal, you will receive your results only by:  MyChart Message (if you have MyChart) OR  A paper copy in the mail If you have any lab test that is abnormal or we need to change your treatment, we will call you to review the results.  Testing/Procedures: CT SEE NEXT PAGE-IN MAY 2025  Follow-Up: At [facility], you and your health needs are our priority.  As part of our continuing mission to provide you with exceptional heart care, we have created designated Provider Care Teams.  These Care Teams include your primary Cardiologist (physician) and Advanced Practice Providers (APPs -  Physician Assistants and Nurse Practitioners) who all work together to provide you with the care you need, when you need it.  Your next appointment:   12 month(s)  Provider:   Nicki Guadalajara, MD  or Azalee Course, PA-C or MADISON FOUNTAIN, NP         Other Instructions     Your cardiac CT will be scheduled at one of the below locations:    [facility] [address] [place], [place] [address] [phone]   If scheduled at [facility], please arrive at the [facility] and Children's Entrance (Entrance C2) of [facility] 30 minutes prior to test start time. You can use the FREE valet parking offered at entrance C (encouraged to control the heart rate for the test)  Proceed to the [facility] Radiology Department (first floor) to check-in and test prep.   All radiology patients and guests should use entrance C2 at [facility], accessed from [facility], even though the hospital's  physical address listed is [name].      If scheduled at [facility] or [facility], please arrive 15 mins early for check-in and test prep.     Please follow these instructions carefully (unless otherwise directed):   Hold all erectile dysfunction medications at least 3 days (72 hrs) prior to test. (Ie viagra, cialis, sildenafil, tadalafil, etc) We will administer nitroglycerin during this exam.    On the Night Before the Test: Be sure to Drink plenty of water. Do not consume any caffeinated/decaffeinated beverages or chocolate 12 hours prior to your test. Do not take any antihistamines 12 hours prior to your test.   On the Day of the Test: Drink plenty of water until 1 hour prior to the test. Do not eat any food 1 hour prior to test. You may take your regular medications prior to the test.  HOLD Furosemide/Hydrochlorothiazide morning of the test. FEMALES- please wear underwire-free bra if available, avoid dresses & tight clothing      After the Test: Drink plenty of water. After receiving IV contrast, you may experience a mild flushed feeling. This is normal. On occasion, you may experience a mild rash up to 24 hours after the test. This is not dangerous. If this occurs, you can take Benadryl 25 mg and increase your fluid intake. If you experience trouble breathing, this can be serious. If it is severe call 911 IMMEDIATELY. If it is mild, please call  our office. If you take any of these medications: Glipizide/Metformin, Avandament, Glucavance, please do not take 48 hours after completing test unless otherwise instructed.   We will call to schedule your test 2-4 weeks out understanding that some insurance companies will need an authorization prior to the service being performed.    For non-scheduling related questions, please contact the cardiac imaging nurse navigator should you have any questions/concerns: Rockwell Alexandria, Cardiac Imaging Nurse Navigator Larey Brick, Cardiac Imaging Nurse Navigator [facility] Direct Office Dial: [phone]    For scheduling needs, including cancellations and rescheduling, please call [place], [phone].

## 2023-11-19 ENCOUNTER — Other Ambulatory Visit (HOSPITAL_COMMUNITY): Payer: Self-pay

## 2023-11-19 MED ORDER — METFORMIN HCL ER 500 MG PO TB24
500.0000 mg | ORAL_TABLET | Freq: Every day | ORAL | 0 refills | Status: DC
  Filled 2023-11-19: qty 30, 30d supply, fill #0

## 2023-11-23 ENCOUNTER — Other Ambulatory Visit: Payer: Self-pay | Admitting: Obstetrics and Gynecology

## 2023-11-23 DIAGNOSIS — Z1231 Encounter for screening mammogram for malignant neoplasm of breast: Secondary | ICD-10-CM

## 2023-11-25 ENCOUNTER — Other Ambulatory Visit (HOSPITAL_COMMUNITY): Payer: Self-pay

## 2023-12-02 ENCOUNTER — Other Ambulatory Visit (HOSPITAL_COMMUNITY): Payer: Self-pay

## 2023-12-02 MED ORDER — LEVOTHYROXINE SODIUM 75 MCG PO TABS
75.0000 ug | ORAL_TABLET | Freq: Every day | ORAL | 0 refills | Status: DC
  Filled 2023-12-02: qty 90, 90d supply, fill #0

## 2023-12-02 MED ORDER — VENLAFAXINE HCL ER 75 MG PO CP24
75.0000 mg | ORAL_CAPSULE | Freq: Every day | ORAL | 1 refills | Status: DC
  Filled 2023-12-02: qty 90, 90d supply, fill #0
  Filled 2024-04-06: qty 90, 90d supply, fill #1

## 2023-12-02 MED ORDER — LISINOPRIL 40 MG PO TABS
40.0000 mg | ORAL_TABLET | Freq: Every day | ORAL | 0 refills | Status: AC
  Filled 2023-12-02: qty 90, 90d supply, fill #0

## 2023-12-14 ENCOUNTER — Encounter (HOSPITAL_COMMUNITY): Payer: Self-pay

## 2023-12-14 ENCOUNTER — Other Ambulatory Visit (HOSPITAL_COMMUNITY): Payer: Self-pay

## 2023-12-14 MED ORDER — MOUNJARO 2.5 MG/0.5ML ~~LOC~~ SOAJ
2.5000 mg | SUBCUTANEOUS | 0 refills | Status: DC
  Filled 2023-12-14 – 2023-12-17 (×2): qty 2, 28d supply, fill #0

## 2023-12-15 ENCOUNTER — Ambulatory Visit
Admission: RE | Admit: 2023-12-15 | Discharge: 2023-12-15 | Disposition: A | Discharge status: Home / Self Care | Payer: No Typology Code available for payment source | Source: Ambulatory Visit | Attending: Obstetrics and Gynecology | Admitting: Obstetrics and Gynecology

## 2023-12-15 DIAGNOSIS — Z1231 Encounter for screening mammogram for malignant neoplasm of breast: Secondary | ICD-10-CM

## 2023-12-17 ENCOUNTER — Other Ambulatory Visit (HOSPITAL_COMMUNITY): Payer: Self-pay

## 2023-12-20 ENCOUNTER — Encounter: Payer: Self-pay | Admitting: Internal Medicine

## 2023-12-20 NOTE — Assessment & Plan Note (Signed)
 No recent exacerbation

## 2023-12-20 NOTE — Assessment & Plan Note (Signed)
 She choose to explore fitted oral appliance Plan- referred to Dr Myrtis Ser

## 2023-12-21 ENCOUNTER — Other Ambulatory Visit (HOSPITAL_COMMUNITY): Payer: Self-pay

## 2023-12-28 ENCOUNTER — Other Ambulatory Visit (HOSPITAL_COMMUNITY): Payer: Self-pay

## 2024-01-11 ENCOUNTER — Other Ambulatory Visit (HOSPITAL_COMMUNITY): Payer: Self-pay

## 2024-01-11 MED ORDER — MOUNJARO 5 MG/0.5ML ~~LOC~~ SOAJ
5.0000 mg | SUBCUTANEOUS | 1 refills | Status: DC
  Filled 2024-01-11 – 2024-01-25 (×2): qty 2, 28d supply, fill #0
  Filled 2024-02-17: qty 2, 28d supply, fill #1

## 2024-01-21 ENCOUNTER — Other Ambulatory Visit (HOSPITAL_COMMUNITY): Payer: Self-pay

## 2024-01-24 ENCOUNTER — Encounter (HOSPITAL_COMMUNITY): Payer: Self-pay

## 2024-01-24 ENCOUNTER — Other Ambulatory Visit (HOSPITAL_COMMUNITY): Payer: Self-pay

## 2024-01-25 ENCOUNTER — Other Ambulatory Visit (HOSPITAL_COMMUNITY): Payer: Self-pay

## 2024-01-25 MED ORDER — ROSUVASTATIN CALCIUM 20 MG PO TABS
20.0000 mg | ORAL_TABLET | Freq: Every day | ORAL | 1 refills | Status: DC
  Filled 2024-01-25: qty 90, 90d supply, fill #0
  Filled 2024-05-05: qty 90, 90d supply, fill #1

## 2024-01-25 MED ORDER — EZETIMIBE 10 MG PO TABS
10.0000 mg | ORAL_TABLET | Freq: Every day | ORAL | 1 refills | Status: DC
  Filled 2024-01-25: qty 90, 90d supply, fill #0
  Filled 2024-05-05: qty 90, 90d supply, fill #1

## 2024-01-29 ENCOUNTER — Other Ambulatory Visit (HOSPITAL_COMMUNITY): Payer: Self-pay

## 2024-02-18 ENCOUNTER — Other Ambulatory Visit (HOSPITAL_COMMUNITY): Payer: Self-pay

## 2024-03-01 ENCOUNTER — Other Ambulatory Visit (HOSPITAL_COMMUNITY): Payer: Self-pay

## 2024-03-01 MED ORDER — MOUNJARO 5 MG/0.5ML ~~LOC~~ SOAJ
5.0000 mg | SUBCUTANEOUS | 0 refills | Status: DC
Start: 2024-03-01 — End: 2024-04-06
  Filled 2024-03-01 – 2024-03-19 (×3): qty 2, 28d supply, fill #0

## 2024-03-02 ENCOUNTER — Other Ambulatory Visit (HOSPITAL_COMMUNITY): Payer: Self-pay

## 2024-03-02 MED ORDER — MICROLET LANCETS MISC
1 refills | Status: AC
Start: 2024-03-02 — End: ?
  Filled 2024-03-02: qty 100, 90d supply, fill #0

## 2024-03-02 MED ORDER — CONTOUR NEXT TEST VI STRP
ORAL_STRIP | 2 refills | Status: AC
Start: 2024-03-02 — End: ?
  Filled 2024-03-02: qty 100, 90d supply, fill #0

## 2024-03-02 MED ORDER — CONTOUR NEXT MONITOR W/DEVICE KIT
PACK | 0 refills | Status: AC
  Filled 2024-03-02: qty 1, 365d supply, fill #0

## 2024-03-03 ENCOUNTER — Other Ambulatory Visit (HOSPITAL_COMMUNITY): Payer: Self-pay

## 2024-03-03 MED ORDER — LEVOTHYROXINE SODIUM 75 MCG PO TABS
75.0000 ug | ORAL_TABLET | Freq: Every day | ORAL | 0 refills | Status: DC
  Filled 2024-03-03: qty 90, 90d supply, fill #0

## 2024-03-19 ENCOUNTER — Other Ambulatory Visit (HOSPITAL_BASED_OUTPATIENT_CLINIC_OR_DEPARTMENT_OTHER): Payer: Self-pay

## 2024-03-21 ENCOUNTER — Other Ambulatory Visit (HOSPITAL_COMMUNITY): Payer: Self-pay

## 2024-04-06 ENCOUNTER — Other Ambulatory Visit (HOSPITAL_COMMUNITY): Payer: Self-pay

## 2024-04-06 MED ORDER — MOUNJARO 5 MG/0.5ML ~~LOC~~ SOAJ
5.0000 mg | SUBCUTANEOUS | 0 refills | Status: DC
Start: 2024-04-06 — End: 2024-06-29
  Filled 2024-04-06: qty 2, 28d supply, fill #0
  Filled 2024-04-12: qty 6, 84d supply, fill #0

## 2024-04-07 ENCOUNTER — Other Ambulatory Visit (HOSPITAL_COMMUNITY): Payer: Self-pay

## 2024-04-07 MED ORDER — LISINOPRIL 40 MG PO TABS
40.0000 mg | ORAL_TABLET | Freq: Every day | ORAL | 0 refills | Status: DC
  Filled 2024-04-07: qty 90, 90d supply, fill #0

## 2024-04-07 MED ORDER — AMITRIPTYLINE HCL 25 MG PO TABS
25.0000 mg | ORAL_TABLET | Freq: Every day | ORAL | 1 refills | Status: DC
  Filled 2024-04-07: qty 90, 90d supply, fill #0
  Filled 2024-07-04: qty 90, 90d supply, fill #1

## 2024-04-11 ENCOUNTER — Encounter (HOSPITAL_COMMUNITY): Payer: Self-pay

## 2024-04-11 ENCOUNTER — Other Ambulatory Visit (HOSPITAL_COMMUNITY): Payer: Self-pay

## 2024-04-12 ENCOUNTER — Other Ambulatory Visit (HOSPITAL_COMMUNITY): Payer: Self-pay

## 2024-05-17 ENCOUNTER — Ambulatory Visit (HOSPITAL_BASED_OUTPATIENT_CLINIC_OR_DEPARTMENT_OTHER)
Admission: RE | Admit: 2024-05-17 | Discharge: 2024-05-17 | Disposition: A | Discharge status: Home / Self Care | Source: Ambulatory Visit | Attending: Emergency Medicine | Admitting: Emergency Medicine

## 2024-05-17 ENCOUNTER — Encounter (HOSPITAL_BASED_OUTPATIENT_CLINIC_OR_DEPARTMENT_OTHER): Payer: Self-pay

## 2024-05-17 DIAGNOSIS — I7121 Aneurysm of the ascending aorta, without rupture: Secondary | ICD-10-CM | POA: Insufficient documentation

## 2024-05-17 MED ORDER — IOHEXOL 350 MG/ML SOLN
100.0000 mL | Freq: Once | INTRAVENOUS | Status: AC | PRN
  Administered 2024-05-17: 75 mL via INTRAVENOUS

## 2024-05-18 ENCOUNTER — Ambulatory Visit: Payer: Self-pay | Admitting: Emergency Medicine

## 2024-05-19 LAB — POCT I-STAT CREATININE: Creatinine, Ser: 0.8 mg/dL (ref 0.44–1.00)

## 2024-06-01 ENCOUNTER — Other Ambulatory Visit (HOSPITAL_COMMUNITY): Payer: Self-pay

## 2024-06-02 ENCOUNTER — Other Ambulatory Visit (HOSPITAL_COMMUNITY): Payer: Self-pay

## 2024-06-02 MED ORDER — LEVOTHYROXINE SODIUM 75 MCG PO TABS
75.0000 ug | ORAL_TABLET | Freq: Every day | ORAL | 0 refills | Status: DC
  Filled 2024-06-02: qty 90, 90d supply, fill #0

## 2024-06-29 ENCOUNTER — Other Ambulatory Visit (HOSPITAL_COMMUNITY): Payer: Self-pay

## 2024-06-29 MED ORDER — MOUNJARO 7.5 MG/0.5ML ~~LOC~~ SOAJ
7.5000 mg | SUBCUTANEOUS | 6 refills | Status: AC
  Filled 2024-06-29 – 2024-07-06 (×2): qty 6, 84d supply, fill #0
  Filled 2025-01-10: qty 2, 28d supply, fill #1

## 2024-06-30 ENCOUNTER — Other Ambulatory Visit (HOSPITAL_COMMUNITY): Payer: Self-pay

## 2024-06-30 ENCOUNTER — Encounter (HOSPITAL_COMMUNITY): Payer: Self-pay

## 2024-07-04 ENCOUNTER — Other Ambulatory Visit: Payer: Self-pay

## 2024-07-04 ENCOUNTER — Other Ambulatory Visit (HOSPITAL_COMMUNITY): Payer: Self-pay

## 2024-07-04 MED ORDER — LEVOTHYROXINE SODIUM 75 MCG PO TABS
75.0000 ug | ORAL_TABLET | Freq: Every day | ORAL | 0 refills | Status: DC
  Filled 2024-09-08: qty 90, 90d supply, fill #0

## 2024-07-04 MED ORDER — LISINOPRIL 40 MG PO TABS
40.0000 mg | ORAL_TABLET | Freq: Every day | ORAL | 0 refills | Status: AC
  Filled 2024-07-04: qty 90, 90d supply, fill #0

## 2024-07-04 MED ORDER — ROSUVASTATIN CALCIUM 20 MG PO TABS
20.0000 mg | ORAL_TABLET | Freq: Every day | ORAL | 1 refills | Status: DC
  Filled 2024-07-17: qty 90, 90d supply, fill #0
  Filled 2024-10-12: qty 90, 90d supply, fill #1

## 2024-07-04 MED ORDER — EZETIMIBE 10 MG PO TABS
10.0000 mg | ORAL_TABLET | Freq: Every day | ORAL | 1 refills | Status: AC
  Filled 2024-08-09: qty 90, 90d supply, fill #0
  Filled 2024-10-12 – 2024-11-03 (×2): qty 90, 90d supply, fill #1

## 2024-07-06 ENCOUNTER — Other Ambulatory Visit (HOSPITAL_COMMUNITY): Payer: Self-pay

## 2024-07-08 ENCOUNTER — Other Ambulatory Visit (HOSPITAL_COMMUNITY): Payer: Self-pay

## 2024-07-17 ENCOUNTER — Other Ambulatory Visit (HOSPITAL_COMMUNITY): Payer: Self-pay

## 2024-07-18 ENCOUNTER — Other Ambulatory Visit (HOSPITAL_COMMUNITY): Payer: Self-pay

## 2024-07-18 ENCOUNTER — Other Ambulatory Visit: Payer: Self-pay

## 2024-07-18 MED ORDER — VENLAFAXINE HCL ER 75 MG PO CP24
75.0000 mg | ORAL_CAPSULE | Freq: Every day | ORAL | 1 refills | Status: DC
  Filled 2024-07-18: qty 90, 90d supply, fill #0
  Filled 2024-10-12: qty 90, 90d supply, fill #1

## 2024-07-21 ENCOUNTER — Other Ambulatory Visit (HOSPITAL_BASED_OUTPATIENT_CLINIC_OR_DEPARTMENT_OTHER): Payer: Self-pay

## 2024-08-09 ENCOUNTER — Other Ambulatory Visit (HOSPITAL_COMMUNITY): Payer: Self-pay

## 2024-08-09 ENCOUNTER — Encounter: Payer: Self-pay | Admitting: Pharmacist

## 2024-08-09 ENCOUNTER — Other Ambulatory Visit: Payer: Self-pay

## 2024-08-10 ENCOUNTER — Other Ambulatory Visit (HOSPITAL_COMMUNITY): Payer: Self-pay

## 2024-08-16 ENCOUNTER — Other Ambulatory Visit (HOSPITAL_COMMUNITY): Payer: Self-pay

## 2024-08-16 MED ORDER — MOUNJARO 10 MG/0.5ML ~~LOC~~ SOAJ
10.0000 mg | SUBCUTANEOUS | 0 refills | Status: AC
  Filled 2024-08-16: qty 6, 84d supply, fill #0
  Filled 2024-11-29 – 2025-01-13 (×2): qty 2, 28d supply, fill #0

## 2024-08-22 ENCOUNTER — Other Ambulatory Visit (HOSPITAL_COMMUNITY): Payer: Self-pay

## 2024-08-22 MED ORDER — MOUNJARO 10 MG/0.5ML ~~LOC~~ SOAJ
10.0000 mg | SUBCUTANEOUS | 0 refills | Status: DC
  Filled 2024-08-22 – 2024-08-29 (×2): qty 2, 28d supply, fill #0
  Filled 2024-08-29: qty 6, 84d supply, fill #0
  Filled 2024-09-29: qty 2, 28d supply, fill #1
  Filled 2024-11-03: qty 2, 28d supply, fill #2

## 2024-08-28 ENCOUNTER — Other Ambulatory Visit: Payer: Self-pay | Admitting: Cardiology

## 2024-08-29 ENCOUNTER — Other Ambulatory Visit (HOSPITAL_COMMUNITY): Payer: Self-pay

## 2024-08-29 MED ORDER — CARVEDILOL 12.5 MG PO TABS
12.5000 mg | ORAL_TABLET | Freq: Two times a day (BID) | ORAL | 0 refills | Status: DC
  Filled 2024-08-29: qty 180, 90d supply, fill #0

## 2024-09-07 ENCOUNTER — Other Ambulatory Visit (HOSPITAL_COMMUNITY): Payer: Self-pay

## 2024-09-07 MED ORDER — TIZANIDINE HCL 4 MG PO TABS
2.0000 mg | ORAL_TABLET | Freq: Three times a day (TID) | ORAL | 0 refills | Status: AC | PRN
  Filled 2024-09-07: qty 90, 30d supply, fill #0

## 2024-09-09 ENCOUNTER — Other Ambulatory Visit (HOSPITAL_COMMUNITY): Payer: Self-pay

## 2024-09-12 ENCOUNTER — Other Ambulatory Visit (HOSPITAL_COMMUNITY): Payer: Self-pay

## 2024-09-29 ENCOUNTER — Other Ambulatory Visit (HOSPITAL_COMMUNITY): Payer: Self-pay

## 2024-09-30 ENCOUNTER — Other Ambulatory Visit (HOSPITAL_COMMUNITY): Payer: Self-pay

## 2024-09-30 MED ORDER — AMITRIPTYLINE HCL 25 MG PO TABS
25.0000 mg | ORAL_TABLET | Freq: Every day | ORAL | 1 refills | Status: AC
  Filled 2024-09-30: qty 90, 90d supply, fill #0
  Filled 2025-01-10: qty 90, 90d supply, fill #1

## 2024-10-12 ENCOUNTER — Other Ambulatory Visit (HOSPITAL_COMMUNITY): Payer: Self-pay

## 2024-10-12 ENCOUNTER — Other Ambulatory Visit: Payer: Self-pay | Admitting: Emergency Medicine

## 2024-10-12 MED ORDER — MOUNJARO 10 MG/0.5ML ~~LOC~~ SOAJ
10.0000 mg | SUBCUTANEOUS | 0 refills | Status: AC
  Filled 2024-12-06 – 2024-12-16 (×3): qty 2, 28d supply, fill #0
  Filled 2025-01-06: qty 6, 84d supply, fill #0
  Filled 2025-01-10 – 2025-01-13 (×2): qty 2, 28d supply, fill #0

## 2024-10-13 ENCOUNTER — Other Ambulatory Visit: Payer: Self-pay

## 2024-10-13 ENCOUNTER — Other Ambulatory Visit (HOSPITAL_COMMUNITY): Payer: Self-pay

## 2024-10-13 MED ORDER — CARVEDILOL 12.5 MG PO TABS
12.5000 mg | ORAL_TABLET | Freq: Two times a day (BID) | ORAL | 0 refills | Status: AC
  Filled 2024-10-13 – 2024-12-12 (×2): qty 180, 90d supply, fill #0

## 2024-10-19 ENCOUNTER — Other Ambulatory Visit (HOSPITAL_COMMUNITY): Payer: Self-pay

## 2024-10-27 ENCOUNTER — Telehealth (HOSPITAL_BASED_OUTPATIENT_CLINIC_OR_DEPARTMENT_OTHER): Payer: Self-pay

## 2024-10-27 NOTE — Telephone Encounter (Signed)
 Preoperative team, patient has upcoming cardiology clinic appointment.  Please add preoperative cardiac evaluation to appointment notes.  I will defer cardiac evaluation to upcoming appointment.  Thank you for your help.  Josefa HERO. Avante Carneiro NP-C     10/27/2024, 4:35 PM [facility] [address] [place], [place] [address] Office [phone]

## 2024-10-27 NOTE — Telephone Encounter (Signed)
   Pre-operative Risk Assessment    Patient Name: Kimberly Koch  DOB: 12-26-1965 MRN: [id number]   Date of last office visit: 11/18/2023 with Lum Louis, NP Date of next office visit: 11/15/2024 Dr. Ren Crafts   Request for Surgical Clearance    Procedure:  Left partial knee replacement - lateral   Date of Surgery:  Clearance 12/02/24                                 Surgeon:  Dr. Toribio Higashi Surgeon's Group or Practice Name:  [facility]  Phone number:  [phone] Fax number:  [phone]   Type of Clearance Requested:   - Medical  - Pharmacy:  Hold Aspirin  - Does not specify   Type of Anesthesia:  Spinal   Additional requests/questions:  None  SignedPatrcia Iverson CROME   10/27/2024, 10:09 AM

## 2024-11-03 ENCOUNTER — Other Ambulatory Visit (HOSPITAL_COMMUNITY): Payer: Self-pay

## 2024-11-04 ENCOUNTER — Other Ambulatory Visit: Payer: Self-pay

## 2024-11-04 ENCOUNTER — Encounter (HOSPITAL_COMMUNITY): Payer: Self-pay

## 2024-11-04 ENCOUNTER — Other Ambulatory Visit (HOSPITAL_COMMUNITY): Payer: Self-pay

## 2024-11-04 MED ORDER — VENLAFAXINE HCL ER 75 MG PO CP24
75.0000 mg | ORAL_CAPSULE | Freq: Every day | ORAL | 1 refills | Status: AC
  Filled 2025-01-13: qty 90, 90d supply, fill #0

## 2024-11-04 MED ORDER — LISINOPRIL 40 MG PO TABS
40.0000 mg | ORAL_TABLET | Freq: Every day | ORAL | 1 refills | Status: AC
  Filled 2024-11-04 (×2): qty 90, 90d supply, fill #0

## 2024-11-07 ENCOUNTER — Other Ambulatory Visit: Payer: Self-pay

## 2024-11-07 ENCOUNTER — Other Ambulatory Visit (HOSPITAL_COMMUNITY): Payer: Self-pay | Admitting: Sports Medicine

## 2024-11-07 ENCOUNTER — Other Ambulatory Visit (HOSPITAL_COMMUNITY): Payer: Self-pay

## 2024-11-07 ENCOUNTER — Ambulatory Visit (HOSPITAL_COMMUNITY)
Admission: RE | Admit: 2024-11-07 | Discharge: 2024-11-07 | Disposition: A | Discharge status: Home / Self Care | Source: Ambulatory Visit | Attending: Surgery | Admitting: Surgery

## 2024-11-07 DIAGNOSIS — M25472 Effusion, left ankle: Secondary | ICD-10-CM | POA: Insufficient documentation

## 2024-11-07 DIAGNOSIS — M25572 Pain in left ankle and joints of left foot: Secondary | ICD-10-CM | POA: Diagnosis not present

## 2024-11-07 MED ORDER — HYDROCODONE-ACETAMINOPHEN 10-325 MG PO TABS
1.0000 | ORAL_TABLET | Freq: Three times a day (TID) | ORAL | 0 refills | Status: AC
  Filled 2024-11-07: qty 15, 5d supply, fill #0

## 2024-11-07 MED ORDER — METHYLPREDNISOLONE 4 MG PO TBPK
ORAL_TABLET | ORAL | 0 refills | Status: AC
  Filled 2024-11-07: qty 21, 6d supply, fill #0

## 2024-11-14 NOTE — Progress Notes (Unsigned)
    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 female who presents for pre op for knee surgery.  She has been doing well since last visit.  She reports losing about 25 pounds on Mounjaro .  She denies any chest pain or dyspnea with exertion.  She reports she could walk up a flight of stairs prior to her left knee pain.  She denies any orthopnea, PND, leg edema.  Her blood pressure has been controlled at home and she reports adherence to her medications.    ROS: Please see the history of present illness. All other systems are reviewed and negative.    PHYSICAL EXAM: VS:  BP 102/66 (BP Location: Left Arm, Patient Position: Sitting, Cuff Size: Normal)   Pulse 88   Resp 14   Ht 5' 8 (1.727 m)   Wt 176 lb (79.8 kg)   SpO2 93%   BMI 26.76 kg/m  , BMI Body mass index is 26.76 kg/m. GEN: Well nourished, well developed, in no acute distress HEENT: normal Neck: no JVD, carotid bruits, or masses Cardiac: RRR; no murmurs, rubs, or gallops,no edema  Respiratory:  CTAB bilaterally, normal work of breathing GI: soft, nontender, nondistended, + BS Extremities: No LE edema Skin: warm and dry, no rash Neuro:  Strength and sensation are intact  Recent Labs: Reviewed  Studies: Reviewed  ASSESSMENT AND PLAN: MASSIEL STIPP is a 58 y.o. female who presents for follow up. - From preop standpoint, patient is able to walk up a flight of stairs suggesting that she can achieve METS above 4.  Her last TTE was back in 2020 with normal EF.  We will  repeat echo to have baseline function prior to surgery.  If unremarkable, can proceed to surgery without additional evaluation. - Cholesterol and BP significantly controlled.  We will continue current therapy. - We will DC from clinic and have her repeat a CTA in about 2 years given that he has been stable at 4.1cm for the last 2 years.   Signed, Joelle VEAR Ren Donley, MD  11/15/2024 9:22 AM    [facility]

## 2024-11-15 ENCOUNTER — Ambulatory Visit

## 2024-11-15 VITALS — BP 102/66 | HR 88 | Resp 14 | Ht 68.0 in | Wt 176.0 lb

## 2024-11-15 DIAGNOSIS — G4733 Obstructive sleep apnea (adult) (pediatric): Secondary | ICD-10-CM | POA: Diagnosis not present

## 2024-11-15 DIAGNOSIS — Z01810 Encounter for preprocedural cardiovascular examination: Secondary | ICD-10-CM | POA: Diagnosis not present

## 2024-11-15 DIAGNOSIS — I10 Essential (primary) hypertension: Secondary | ICD-10-CM

## 2024-11-15 DIAGNOSIS — E785 Hyperlipidemia, unspecified: Secondary | ICD-10-CM | POA: Diagnosis not present

## 2024-11-15 DIAGNOSIS — R931 Abnormal findings on diagnostic imaging of heart and coronary circulation: Secondary | ICD-10-CM

## 2024-11-15 NOTE — Patient Instructions (Addendum)
 Medication Instructions:  Your physician recommends that you continue on your current medications as directed. Please refer to the Current Medication list given to you today.  *If you need a refill on your cardiac medications before your next appointment, please call your pharmacy*  Lab Work: NONE If you have labs (blood work) drawn today and your tests are completely normal, you will receive your results only by: MyChart Message (if you have MyChart) OR A paper copy in the mail If you have any lab test that is abnormal or we need to change your treatment, we will call you to review the results.  Testing/Procedures: Echocardiogram Your physician has requested that you have an echocardiogram. Echocardiography is a painless test that uses sound waves to create images of your heart. It provides your doctor with information about the size and shape of your heart and how well your heart's chambers and valves are working. This procedure takes approximately one hour. There are no restrictions for this procedure. Please do NOT wear cologne, perfume, aftershave, or lotions (deodorant is allowed). Please arrive 15 minutes prior to your appointment time.  Please note: We ask at that you not bring children with you during ultrasound (echo/ vascular) testing. Due to room size and safety concerns, children are not allowed in the ultrasound rooms during exams. Our front office staff cannot provide observation of children in our lobby area while testing is being conducted. An adult accompanying a patient to their appointment will only be allowed in the ultrasound room at the discretion of the ultrasound technician under special circumstances. We apologize for any inconvenience.   Follow-Up: At [facility] you and your health needs are our priority.  As part of our continuing mission to provide you with exceptional heart care, our providers are all part of one team.  This team includes your primary  Cardiologist (physician) and Advanced Practice Providers or APPs (Physician Assistants and Nurse Practitioners) who all work together to provide you with the care you need, when you need it.  Your next appointment:   As needed  Provider:   Ren, MD  We recommend signing up for the patient portal called MyChart.  Sign up information is provided on this After Visit Summary.  MyChart is used to connect with patients for Virtual Visits (Telemedicine).  Patients are able to view lab/test results, encounter notes, upcoming appointments, etc.  Non-urgent messages can be sent to your provider as well.   To learn more about what you can do with MyChart, go to [email or web address].

## 2024-11-28 ENCOUNTER — Ambulatory Visit (HOSPITAL_COMMUNITY): Admission: RE | Admit: 2024-11-28 | Discharge: 2024-11-28

## 2024-11-28 DIAGNOSIS — Z01810 Encounter for preprocedural cardiovascular examination: Secondary | ICD-10-CM

## 2024-11-28 DIAGNOSIS — R931 Abnormal findings on diagnostic imaging of heart and coronary circulation: Secondary | ICD-10-CM | POA: Insufficient documentation

## 2024-11-28 DIAGNOSIS — I10 Essential (primary) hypertension: Secondary | ICD-10-CM | POA: Diagnosis not present

## 2024-11-28 LAB — ECHOCARDIOGRAM COMPLETE
AR max vel: 2.27 cm2
AV Area VTI: 2.13 cm2
AV Area mean vel: 2.11 cm2
AV Mean grad: 2 mmHg
AV Peak grad: 3.3 mmHg
Ao pk vel: 0.91 m/s
Area-P 1/2: 4.21 cm2
S' Lateral: 2.87 cm

## 2024-11-29 ENCOUNTER — Ambulatory Visit: Payer: Self-pay

## 2024-11-29 ENCOUNTER — Other Ambulatory Visit (HOSPITAL_COMMUNITY): Payer: Self-pay

## 2024-11-29 MED ORDER — NAPROXEN 500 MG PO TABS
500.0000 mg | ORAL_TABLET | Freq: Two times a day (BID) | ORAL | 0 refills | Status: AC | PRN
  Filled 2024-11-29: qty 60, 30d supply, fill #0

## 2024-11-29 MED ORDER — ONDANSETRON 4 MG PO TBDP
4.0000 mg | ORAL_TABLET | Freq: Three times a day (TID) | ORAL | 0 refills | Status: AC | PRN
  Filled 2024-11-29: qty 14, 5d supply, fill #0

## 2024-11-29 MED ORDER — TIZANIDINE HCL 4 MG PO TABS
4.0000 mg | ORAL_TABLET | Freq: Three times a day (TID) | ORAL | 0 refills | Status: AC | PRN
  Filled 2024-11-29: qty 30, 10d supply, fill #0

## 2024-11-29 MED ORDER — HYDROMORPHONE HCL 2 MG PO TABS
2.0000 mg | ORAL_TABLET | ORAL | 0 refills | Status: AC
  Filled 2024-11-29: qty 30, 5d supply, fill #0

## 2024-11-30 ENCOUNTER — Other Ambulatory Visit (HOSPITAL_COMMUNITY): Payer: Self-pay

## 2024-12-06 ENCOUNTER — Other Ambulatory Visit (HOSPITAL_COMMUNITY): Payer: Self-pay

## 2024-12-12 ENCOUNTER — Other Ambulatory Visit (HOSPITAL_COMMUNITY): Payer: Self-pay

## 2024-12-16 ENCOUNTER — Other Ambulatory Visit (HOSPITAL_COMMUNITY): Payer: Self-pay

## 2024-12-28 ENCOUNTER — Encounter (HOSPITAL_COMMUNITY): Payer: Self-pay

## 2024-12-28 ENCOUNTER — Other Ambulatory Visit (HOSPITAL_COMMUNITY): Payer: Self-pay

## 2024-12-28 MED ORDER — ONDANSETRON HCL 8 MG PO TABS
8.0000 mg | ORAL_TABLET | Freq: Three times a day (TID) | ORAL | 0 refills | Status: AC | PRN
  Filled 2024-12-28 – 2025-01-02 (×2): qty 30, 10d supply, fill #0

## 2024-12-28 MED ORDER — MOUNJARO 5 MG/0.5ML ~~LOC~~ SOAJ
5.0000 mg | SUBCUTANEOUS | 0 refills | Status: AC
  Filled 2024-12-28 – 2025-01-10 (×3): qty 2, 28d supply, fill #0

## 2025-01-02 ENCOUNTER — Other Ambulatory Visit (HOSPITAL_COMMUNITY): Payer: Self-pay

## 2025-01-02 ENCOUNTER — Other Ambulatory Visit: Payer: Self-pay

## 2025-01-02 MED ORDER — LEVOTHYROXINE SODIUM 75 MCG PO TABS
75.0000 ug | ORAL_TABLET | Freq: Every day | ORAL | 0 refills | Status: AC
  Filled 2025-01-02: qty 90, 90d supply, fill #0

## 2025-01-03 ENCOUNTER — Other Ambulatory Visit (HOSPITAL_COMMUNITY): Payer: Self-pay

## 2025-01-04 ENCOUNTER — Other Ambulatory Visit (HOSPITAL_COMMUNITY): Payer: Self-pay

## 2025-01-06 ENCOUNTER — Other Ambulatory Visit (HOSPITAL_COMMUNITY): Payer: Self-pay

## 2025-01-10 ENCOUNTER — Other Ambulatory Visit (HOSPITAL_COMMUNITY): Payer: Self-pay

## 2025-01-10 MED ORDER — MOUNJARO 5 MG/0.5ML ~~LOC~~ SOAJ
5.0000 mg | SUBCUTANEOUS | 0 refills | Status: AC
  Filled 2025-01-10 – 2025-01-17 (×3): qty 2, 28d supply, fill #0

## 2025-01-11 ENCOUNTER — Other Ambulatory Visit (HOSPITAL_COMMUNITY): Payer: Self-pay

## 2025-01-13 ENCOUNTER — Other Ambulatory Visit: Payer: Self-pay

## 2025-01-13 ENCOUNTER — Other Ambulatory Visit (HOSPITAL_COMMUNITY): Payer: Self-pay

## 2025-01-13 MED ORDER — LISINOPRIL 40 MG PO TABS
40.0000 mg | ORAL_TABLET | Freq: Every day | ORAL | 1 refills | Status: AC
  Filled 2025-01-13: qty 90, 90d supply, fill #0

## 2025-01-17 ENCOUNTER — Other Ambulatory Visit (HOSPITAL_COMMUNITY): Payer: Self-pay
